# Patient Record
Sex: Female | Born: 1955
Health system: Southern US, Community
[De-identification: ages and names within clinical notes are randomized; demographics above are authoritative.]

## PROBLEM LIST (undated history)

## (undated) DIAGNOSIS — K219 Gastro-esophageal reflux disease without esophagitis: Secondary | ICD-10-CM

## (undated) DIAGNOSIS — Z808 Family history of malignant neoplasm of other organs or systems: Secondary | ICD-10-CM

## (undated) DIAGNOSIS — E890 Postprocedural hypothyroidism: Secondary | ICD-10-CM

## (undated) DIAGNOSIS — G4730 Sleep apnea, unspecified: Secondary | ICD-10-CM

## (undated) DIAGNOSIS — Z803 Family history of malignant neoplasm of breast: Secondary | ICD-10-CM

## (undated) DIAGNOSIS — I10 Essential (primary) hypertension: Secondary | ICD-10-CM

## (undated) DIAGNOSIS — E063 Autoimmune thyroiditis: Secondary | ICD-10-CM

## (undated) DIAGNOSIS — Z800 Family history of malignant neoplasm of digestive organs: Secondary | ICD-10-CM

## (undated) DIAGNOSIS — E0500 Thyrotoxicosis with diffuse goiter without thyrotoxic crisis or storm: Secondary | ICD-10-CM

## (undated) DIAGNOSIS — E042 Nontoxic multinodular goiter: Secondary | ICD-10-CM

## (undated) HISTORY — DX: Essential (primary) hypertension: I10

## (undated) HISTORY — DX: Thyrotoxicosis with diffuse goiter without thyrotoxic crisis or storm: E05.00

## (undated) HISTORY — DX: Postprocedural hypothyroidism: E89.0

## (undated) HISTORY — PX: TUBAL LIGATION: SHX77

## (undated) HISTORY — PX: THYROIDECTOMY, PARTIAL: SHX18

## (undated) HISTORY — DX: Family history of malignant neoplasm of breast: Z80.3

## (undated) HISTORY — DX: Autoimmune thyroiditis: E06.3

## (undated) HISTORY — DX: Nontoxic multinodular goiter: E04.2

## (undated) HISTORY — DX: Family history of malignant neoplasm of other organs or systems: Z80.8

## (undated) HISTORY — DX: Family history of malignant neoplasm of digestive organs: Z80.0

---

## 1982-11-24 HISTORY — PX: THYROIDECTOMY, PARTIAL: SHX18

## 1985-11-24 HISTORY — PX: TUBAL LIGATION: SHX77

## 1999-02-26 ENCOUNTER — Other Ambulatory Visit: Admission: RE | Admit: 1999-02-26 | Discharge: 1999-02-26 | Payer: Self-pay | Admitting: Obstetrics & Gynecology

## 2002-06-29 ENCOUNTER — Other Ambulatory Visit: Admission: RE | Admit: 2002-06-29 | Discharge: 2002-06-29 | Payer: Self-pay | Admitting: Obstetrics & Gynecology

## 2002-07-04 ENCOUNTER — Encounter: Payer: Self-pay | Admitting: Obstetrics & Gynecology

## 2002-07-04 ENCOUNTER — Encounter: Admission: RE | Admit: 2002-07-04 | Discharge: 2002-07-04 | Payer: Self-pay | Admitting: Obstetrics & Gynecology

## 2003-10-11 ENCOUNTER — Other Ambulatory Visit: Admission: RE | Admit: 2003-10-11 | Discharge: 2003-10-11 | Payer: Self-pay | Admitting: Obstetrics & Gynecology

## 2004-11-05 ENCOUNTER — Other Ambulatory Visit: Admission: RE | Admit: 2004-11-05 | Discharge: 2004-11-05 | Payer: Self-pay | Admitting: Obstetrics & Gynecology

## 2005-05-08 ENCOUNTER — Encounter: Admission: RE | Admit: 2005-05-08 | Discharge: 2005-05-08 | Payer: Self-pay | Admitting: Emergency Medicine

## 2005-05-18 ENCOUNTER — Encounter: Admission: RE | Admit: 2005-05-18 | Discharge: 2005-05-18 | Payer: Self-pay | Admitting: Emergency Medicine

## 2005-05-30 ENCOUNTER — Encounter: Admission: RE | Admit: 2005-05-30 | Discharge: 2005-05-30 | Payer: Self-pay | Admitting: Emergency Medicine

## 2005-07-21 ENCOUNTER — Encounter: Admission: RE | Admit: 2005-07-21 | Discharge: 2005-07-21 | Payer: Self-pay | Admitting: Otolaryngology

## 2005-07-21 ENCOUNTER — Encounter (INDEPENDENT_AMBULATORY_CARE_PROVIDER_SITE_OTHER): Payer: Self-pay | Admitting: *Deleted

## 2005-07-21 ENCOUNTER — Other Ambulatory Visit: Admission: RE | Admit: 2005-07-21 | Discharge: 2005-07-21 | Payer: Self-pay | Admitting: Interventional Radiology

## 2005-08-26 ENCOUNTER — Ambulatory Visit: Payer: Self-pay | Admitting: "Endocrinology

## 2005-12-03 ENCOUNTER — Encounter: Admission: RE | Admit: 2005-12-03 | Discharge: 2005-12-03 | Payer: Self-pay | Admitting: *Deleted

## 2006-01-06 ENCOUNTER — Ambulatory Visit: Payer: Self-pay | Admitting: "Endocrinology

## 2006-01-26 ENCOUNTER — Other Ambulatory Visit: Admission: RE | Admit: 2006-01-26 | Discharge: 2006-01-26 | Payer: Self-pay | Admitting: Obstetrics & Gynecology

## 2006-04-13 ENCOUNTER — Encounter: Admission: RE | Admit: 2006-04-13 | Discharge: 2006-04-13 | Payer: Self-pay | Admitting: "Endocrinology

## 2006-04-29 ENCOUNTER — Encounter (INDEPENDENT_AMBULATORY_CARE_PROVIDER_SITE_OTHER): Payer: Self-pay | Admitting: *Deleted

## 2006-04-29 ENCOUNTER — Encounter: Admission: RE | Admit: 2006-04-29 | Discharge: 2006-04-29 | Payer: Self-pay | Admitting: "Endocrinology

## 2006-04-29 ENCOUNTER — Other Ambulatory Visit: Admission: RE | Admit: 2006-04-29 | Discharge: 2006-04-29 | Payer: Self-pay | Admitting: Interventional Radiology

## 2006-05-12 ENCOUNTER — Ambulatory Visit: Payer: Self-pay | Admitting: "Endocrinology

## 2006-12-07 ENCOUNTER — Encounter: Admission: RE | Admit: 2006-12-07 | Discharge: 2006-12-07 | Payer: Self-pay | Admitting: "Endocrinology

## 2006-12-24 ENCOUNTER — Ambulatory Visit: Payer: Self-pay | Admitting: "Endocrinology

## 2007-05-19 ENCOUNTER — Encounter: Admission: RE | Admit: 2007-05-19 | Discharge: 2007-05-19 | Payer: Self-pay | Admitting: "Endocrinology

## 2007-06-02 ENCOUNTER — Ambulatory Visit: Payer: Self-pay | Admitting: "Endocrinology

## 2007-11-30 ENCOUNTER — Ambulatory Visit: Payer: Self-pay | Admitting: "Endocrinology

## 2008-05-31 ENCOUNTER — Encounter: Admission: RE | Admit: 2008-05-31 | Discharge: 2008-05-31 | Payer: Self-pay | Admitting: "Endocrinology

## 2008-06-22 ENCOUNTER — Ambulatory Visit: Payer: Self-pay | Admitting: "Endocrinology

## 2008-07-27 ENCOUNTER — Other Ambulatory Visit: Admission: RE | Admit: 2008-07-27 | Discharge: 2008-07-27 | Payer: Self-pay | Admitting: Interventional Radiology

## 2008-07-27 ENCOUNTER — Encounter: Admission: RE | Admit: 2008-07-27 | Discharge: 2008-07-27 | Payer: Self-pay | Admitting: "Endocrinology

## 2008-07-27 ENCOUNTER — Encounter (INDEPENDENT_AMBULATORY_CARE_PROVIDER_SITE_OTHER): Payer: Self-pay | Admitting: Interventional Radiology

## 2009-03-05 ENCOUNTER — Encounter: Admission: RE | Admit: 2009-03-05 | Discharge: 2009-03-05 | Payer: Self-pay | Admitting: "Endocrinology

## 2009-03-19 ENCOUNTER — Ambulatory Visit: Payer: Self-pay | Admitting: "Endocrinology

## 2010-03-07 ENCOUNTER — Encounter: Admission: RE | Admit: 2010-03-07 | Discharge: 2010-03-07 | Payer: Self-pay | Admitting: "Endocrinology

## 2010-03-28 ENCOUNTER — Ambulatory Visit: Payer: Self-pay | Admitting: "Endocrinology

## 2010-10-14 ENCOUNTER — Encounter: Admission: RE | Admit: 2010-10-14 | Discharge: 2010-10-14 | Payer: Self-pay | Admitting: Family Medicine

## 2011-03-05 ENCOUNTER — Other Ambulatory Visit: Payer: Self-pay | Admitting: "Endocrinology

## 2011-03-05 DIAGNOSIS — E049 Nontoxic goiter, unspecified: Secondary | ICD-10-CM

## 2011-03-14 ENCOUNTER — Other Ambulatory Visit: Payer: Self-pay

## 2011-03-17 ENCOUNTER — Other Ambulatory Visit: Payer: Self-pay | Admitting: *Deleted

## 2011-03-17 ENCOUNTER — Encounter: Payer: Self-pay | Admitting: *Deleted

## 2011-03-17 DIAGNOSIS — E041 Nontoxic single thyroid nodule: Secondary | ICD-10-CM

## 2011-03-17 DIAGNOSIS — E890 Postprocedural hypothyroidism: Secondary | ICD-10-CM

## 2011-04-01 ENCOUNTER — Ambulatory Visit
Admission: RE | Admit: 2011-04-01 | Discharge: 2011-04-01 | Disposition: A | Discharge status: Home / Self Care | Payer: 59 | Source: Ambulatory Visit | Attending: "Endocrinology | Admitting: "Endocrinology

## 2011-04-01 DIAGNOSIS — E049 Nontoxic goiter, unspecified: Secondary | ICD-10-CM

## 2011-04-10 ENCOUNTER — Encounter: Payer: Self-pay | Admitting: "Endocrinology

## 2011-04-10 ENCOUNTER — Ambulatory Visit (INDEPENDENT_AMBULATORY_CARE_PROVIDER_SITE_OTHER): Payer: 59 | Admitting: "Endocrinology

## 2011-04-10 VITALS — BP 111/69 | HR 65 | Wt 115.5 lb

## 2011-04-10 DIAGNOSIS — E063 Autoimmune thyroiditis: Secondary | ICD-10-CM

## 2011-04-10 DIAGNOSIS — E890 Postprocedural hypothyroidism: Secondary | ICD-10-CM

## 2011-04-10 DIAGNOSIS — E042 Nontoxic multinodular goiter: Secondary | ICD-10-CM

## 2011-04-10 DIAGNOSIS — E0500 Thyrotoxicosis with diffuse goiter without thyrotoxic crisis or storm: Secondary | ICD-10-CM

## 2011-04-10 NOTE — Progress Notes (Signed)
CC: FU of non-toxic multinodular goiter (NTMNG), exophthalmos, hypothyroidism postoperatively after partial thyroidectomy for Graves' Disease, thyroiditis  HPI: 55 y.o. white woman 1. Patient had a partial thyroidectomy for Thyrotoxicosis due to Graves' Disease about 1986. She had significant exophthalmos at that time. She remained euthyroid for about 10 years, then in about 1996 she became hypothyroid and had to begin Synthroid therapy. Since then she has had TFTs within the normal range on a dose of Synthroid, 75 mcg/day.  2. In early 2006 Ms. Romas developed pains in the posterior cervical spine radiating down her arms. An MRI on 06.25.06 showed   multiple bony lesions, but also a 1.4 cm lesion of the right thyroid lobe, probably consistent with an adenoma. A fine needle aspiration of the nodule was performed on 08.28.06. This study showed abundant colloid and numerous follicles in sheets and balls, more consistent with adenomatous goiter than neoplasia. 3. I saw Ms. Guillotte in consultation on 10.03.06. 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 The lower portion of her right thyroid lobe was relatively more firm than the remainder of the gland. I subsequently reviewed her [place] in radiology and concurred with its findings. I also reviewed her path slides in pathology.. I agreed with the pathologist that there was no evidence of atypia.TFTs showed that she was euthyroid on her Synthroid dose of 75 mcg per day. In May 2007 I ordered a repeat US. This time there was a suggestion that the dominant right lobe nodule and one other nodule might have grown slightly. I therefore ordered a repeat FNA. This study showed scattered groups of follicular epithelium with no atypia, more c/w adenomatous nodule than neoplasia. I again reviewed the path slides with the pathologist and concurred with her conclusions. We continued to do serial [place] studies every 6 months through  July 2009, which essentially remained unchanged. She had her third FNA on 09.04.09. I reviewed those slides with the pathologist, who felt that the findings were most c/w a non-neoplastic goiter. I concurred. Since then we have performed serial [place] studies annually, the most recent being 05.08.12. If anything, the dominant nodule and the other smaller nodules seem to be a bit smaller over time. She remains euthyroid on 75 mcg of Synthroid per day.   4. Ms. Delsanto's last [facility] visit was on 05.05.11. In the interim she has been actively trying to lose weight. She has been eating better. She exercises almost daily. As a result, she has lost about 20 pounds.About two months ago she had a strep throat infection and developed a scarlet fever rash. She was treated with oral antibiotics.  5. PROS: Constitutional: The patient feels well, is healthy, and has no significant complaints. Eyes: Vision is good. There are no significant eye complaints.The exophthalmos continues to improve. Neck: The patient has no complaints of anterior neck swelling, soreness, tenderness,  pressure, discomfort, or difficulty swallowing.  Heart: Heart rate increases with exercise or other physical activity. The patient has no complaints of palpitations, irregular heat beats, chest pain, or chest pressure. Gastrointestinal: Bowel movents seem normal. The patient has no complaints of excessive hunger, acid reflux, upset stomach, stomach aches or pains, diarrhea, or constipation. Legs: Muscle mass and strength seem normal. There are no complaints of numbness, tingling, burning, or pain. No edema is noted. Feet: There are no obvious foot problems. There are no complaints of numbness, tingling, burning, or pain. No edema is noted GYN: Her IUD  was removed sis months ago. She has not had menses since then. She does have occasional hot flushes.Marland Kitchen  PMFSH: 1.She is still very busy at work and at home.  ROS: Ms. Pruden has no significant  complaints related to any of her other eleven body systems.  PHYSICAL EXAM: BP 111/69  Pulse 65  Wt 115 lb 8 oz (52.39 kg) Constitutional: The patient looks healthy and appears physically and emotionally well.  Eyes: There is no arcus or proptosis, but her eyelids are still somewhat swollen. Mouth: The oropharynx appears normal. The tongue appears normal. There is normal oral moisture. There is no obvious gingivitis. Neck: There are no bruits present. The thyroid gland appears normal in size. The thyroid gland is approximately 18-20 grams in size. The consistency of the thyroid gland is normal. There is no thyroid tenderness to palpation. Lungs: The lungs are clear. Air movement is good. Heart: The heart rhythm and rate appear normal. Heart sounds S1 and S2 are normal. I do not appreciate any pathologic heart murmurs. Abdomen: The abdominal size is normal. Bowel sounds are normal. The abdomen is soft and non-tender. There is no obviously palpable hepatomegaly, splenomegaly, or other masses.  Arms: Muscle mass appears appropriate for age. Radial pulses appear normal. Hands: There is no obvious tremor. Phalangeal and metacarpophalangeal joints appear normal. Palms are normal. Legs: Muscle mass appears appropriate for age. There is no edema.  Neurologic: Muscle strength is normal for age and gender  in both the upper and the lower extremities. Muscle tone appears normal. Sensation to touch is normal in the legs.  Thyroid US 05.08.12: The dominant nodule in the right lobe is stable in size, or perhaps 1-2 mm smaller. Labs 05.08.12: TFTs were normal.  ASSESSMENT: 1 Hypothyroid: She is euthyroid on her current dose of Synthroid daily. 2. Non-toxic multinodular goiter: Nodules are essentially unchanged in size or consistency, She does not need a repeat FNA at this time.  3. Exophthalmos: This problem continues to slowly but progressively improve. 4, Hashimoto's disease: This problem is clinically  quiescent. Some of the fluctuations in  TFTs that she has had are consistent with intermittent thyroiditis activity.  PLAN: 1. Repeat TFTs and thyroid US in one year. 2. Continue Synthroid at current dose. 3. Follow-up appointment in one year.

## 2011-04-10 NOTE — Patient Instructions (Signed)
Please repeat thyroid function tests in November. Please also repeat thyroid functio tests and thyroid ultrasound in Apri-May 2012.

## 2011-11-02 ENCOUNTER — Other Ambulatory Visit: Payer: Self-pay | Admitting: "Endocrinology

## 2012-06-09 ENCOUNTER — Other Ambulatory Visit: Payer: Self-pay | Admitting: "Endocrinology

## 2012-06-10 ENCOUNTER — Other Ambulatory Visit: Payer: Self-pay | Admitting: "Endocrinology

## 2012-06-11 ENCOUNTER — Other Ambulatory Visit: Payer: Self-pay | Admitting: *Deleted

## 2012-06-11 DIAGNOSIS — E038 Other specified hypothyroidism: Secondary | ICD-10-CM

## 2012-06-14 LAB — T4, FREE: Free T4: 1.37 ng/dL (ref 0.80–1.80)

## 2012-06-15 ENCOUNTER — Ambulatory Visit
Admission: RE | Admit: 2012-06-15 | Discharge: 2012-06-15 | Disposition: A | Discharge status: Home / Self Care | Payer: 59 | Source: Ambulatory Visit | Attending: "Endocrinology | Admitting: "Endocrinology

## 2012-06-15 DIAGNOSIS — E038 Other specified hypothyroidism: Secondary | ICD-10-CM

## 2012-06-23 ENCOUNTER — Encounter: Payer: Self-pay | Admitting: "Endocrinology

## 2012-06-23 ENCOUNTER — Ambulatory Visit (INDEPENDENT_AMBULATORY_CARE_PROVIDER_SITE_OTHER): Payer: 59 | Admitting: "Endocrinology

## 2012-06-23 VITALS — BP 123/78 | HR 98 | Wt 123.2 lb

## 2012-06-23 DIAGNOSIS — E063 Autoimmune thyroiditis: Secondary | ICD-10-CM

## 2012-06-23 DIAGNOSIS — E0500 Thyrotoxicosis with diffuse goiter without thyrotoxic crisis or storm: Secondary | ICD-10-CM

## 2012-06-23 DIAGNOSIS — E890 Postprocedural hypothyroidism: Secondary | ICD-10-CM

## 2012-06-23 DIAGNOSIS — E042 Nontoxic multinodular goiter: Secondary | ICD-10-CM

## 2012-06-23 DIAGNOSIS — E349 Endocrine disorder, unspecified: Secondary | ICD-10-CM

## 2012-06-23 NOTE — Progress Notes (Signed)
CC: FU of non-toxic multinodular goiter (NTMNG), exophthalmos, hypothyroidism postoperatively after partial thyroidectomy for Graves' Disease, thyroiditis  HPI: 56 y.o. Caucasian woman 1. Patient had a partial thyroidectomy for Thyrotoxicosis for treatment of Graves' Disease about 1986. She had significant exophthalmos at that time. She remained euthyroid for about 10 years, then in about 1996 she became hypothyroid and had to begin Synthroid therapy. Since then she has had TFTs within the normal range on a dose of Synthroid, 75 mcg/day.  2. In early 2006 Ms. Colasurdo developed pains in the posterior cervical spine radiating down her arms. An MRI on 06.25.06 showed   multiple bony lesions, but also a 1.4 cm lesion of the right thyroid lobe, probably consistent with an adenoma. A fine needle aspiration of the nodule was performed on 08.28.06. This study showed abundant colloid and numerous follicles in sheets and balls, more consistent with adenomatous goiter than neoplasia. 3. I saw Ms. Doland in consultation on 10.03.06. 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 The lower portion of her right thyroid lobe was relatively more firm than the remainder of the gland. I subsequently reviewed her [place] in radiology and concurred with its findings. I also reviewed her path slides in pathology. I agreed with the pathologist that there was no evidence of atypia.TFTs showed that she was euthyroid on her Synthroid dose of 75 mcg per day. In May 2007 I ordered a repeat US. This time there was a suggestion that the dominant right lobe nodule and one other nodule might have grown slightly. I therefore ordered a repeat FNA. This study showed scattered groups of follicular epithelium with no atypia, more c/w adenomatous nodule than neoplasia. I again reviewed the path slides with the pathologist and concurred with her conclusions. We continued to do serial [place] studies every 6  months through July 2009, which essentially remained unchanged. She had her third FNA on 09.04.09. I reviewed those slides with the pathologist, who felt that the findings were most c/w a non-neoplastic goiter. I concurred. Since then we have performed serial [place] studies annually, the most recent being 04/01/11. The dominant nodule and the other smaller nodules seemed to be a bit smaller over time. She has remained euthyroid on 75 mcg of Synthroid per day.   4. Ms. Vasconez last [facility] visit was on 04/10/11. In the interim she lost weight intentionally with diet and exercise, then reduced the exercise and re-gained some of the weight. For about 6 weeks she's noted that her stomach hurts when she eats and sometimes when she drinks. The discomfort is located at the junction of the epigastrium and LUQ. She is being followed by Dr. Lavonia Drafts in GI.  5. Pertinent Review of Systems: Constitutional: The patient has felt "a little tired, less peppy" for the past few weeks. She wakes up several times each night, so the quality of her sleep is not as good. She has coffee in the morning and sometimes tea at lunch.  Eyes: Vision is good. There are no significant eye complaints.The exophthalmos continues to improve. Neck: The patient has no complaints of anterior neck swelling, soreness, tenderness, pressure, discomfort, or difficulty swallowing.  Heart: Heart rate increases with exercise or other physical activity. The patient has no complaints of palpitations, irregular heat beats, chest pain, or chest pressure. Gastrointestinal: As above. Bowel movents seem normal. The patient has no complaints of excessive hunger, acid reflux, upset stomach, diarrhea, or constipation. Legs: Muscle mass  and strength seem normal. There are no complaints of numbness, tingling, burning, or pain. No edema is noted. Feet: There are no obvious foot problems. There are no complaints of numbness, tingling, burning, or pain. No edema is  noted GYN: Her IUD was removed almost two years ago. Menses never returned.  She does have occasional hot flushes, but nothing severe.  PAST MEDICAL, FAMILY, AND SOCIAL HISTORY:  1. Work and family: She is still very busy at work and at home. She works about 40-45 hours per week. 2. Activities: She was going to the gym 3-4 times a week for an hour each time, but has not gone in the past 3 weeks. She also walks her dog regularly.  3. Primary care: Dr. Ancil Boozer, [facility] at [facility]. GI: Dr. Susanne Greenhouse.  REVIEW OF SYSTEMS: Ms. Choy has no significant complaints related to any of her other body systems.  PHYSICAL EXAM: BP 123/78  Pulse 98  Wt 123 lb 3.2 oz (55.883 kg) Constitutional: The patient looks healthy and appears physically and emotionally well.  Eyes: There is no arcus or proptosis. Her right eye is slightly more prominent than her left, but within the range of normal variance. She has a minimal amount of lower eyelid swelling on the right, again within the range of normal variance for her age.  Mouth: The oropharynx appears normal. The tongue appears normal. There is normal oral moisture. There is no obvious gingivitis. Neck: There are no bruits present. The thyroid gland is normal in size. The thyroid gland is approximately 18-20 grams in size. The consistency of the thyroid gland is normal. There is no thyroid tenderness to palpation. Supraclavicular areas are normal.  Lungs: The lungs are clear. Air movement is good. Heart: The heart rhythm and rate appear normal. Heart sounds S1 and S2 are normal. I do not appreciate any pathologic heart murmurs. Abdomen: The abdominal size is normal. Bowel sounds are normal. The abdomen is soft and non-tender. There is no obviously palpable hepatomegaly, splenomegaly, or other masses.  Arms: Muscle mass appears appropriate for age.  Hands: There is no obvious tremor. Phalangeal and metacarpophalangeal joints appear normal. Palms are  normal. Legs: Muscle mass appears appropriate for age. There is no edema.  Neurologic: Muscle strength is normal for age and gender  in both the upper and the lower extremities. Muscle tone appears normal. Sensation to touch is normal in the legs.  Thyroid US 7.23.13: The dominant nodule in the right lobe is  2.7 x 2.0 x 1.8 cm vs 2.3 x 1.7 x 1.6, slightly larger than on 04/01/11. Left lobe nodule is 1.2 x 0.8 x 0.8 cm, stable in size. I reviewed her images with Dr. Archer Asa in [facility]. The [place] in 2012 cut across the nodule parallel to the long axis of the nodule. The [place] this year cut across the nodule on a slight diagonal. It appears that the difference in technique probably accounts for the difference in measurements. Dr. Archer Asa recommended repeating the [place] in 6 months.  Labs 07.22.13: TFTs were normal (TSH 1.675, free T4 1.37, free T3 2.4).  ASSESSMENT: 1 Hypothyroid: She is euthyroid on her current dose of Synthroid taken each evening. 2. Non-toxic multinodular goiter: Nodules are probably unchanged in size and are unchanged in consistency. She does not need a repeat FNA at this time. It is reasonable to repeat the [place] in 6 months to ensure that there is no significant interval change. I offered the patient the  options of repeating the [place] in 6 months or going ahead now with a 4th FNA. She prefers to wait 6 months to see if a repeat FNA is really necessary.  3. Exophthalmos: This problem continues to slowly but progressively improve. If I didn't know that she had exophthalmos in the past I would not diagnose it now.  4, Hashimoto's disease: This problem is clinically quiescent.   PLAN: 1. Repeat  thyroid US in six months and TFTs in one year.  2. Continue Synthroid at current dose. 3. Follow-up appointment in 6 months.  Level of Service: This visit lasted in excess of 40 minutes. More than 50% of the visit was devoted to counseling.  David Stall

## 2012-06-23 NOTE — Patient Instructions (Signed)
Repeat US in January 2014. Follow-up visit with Dr. Van Clines soon after the [place]. Continue current dose of Synthroid.

## 2012-06-30 ENCOUNTER — Other Ambulatory Visit: Payer: Self-pay | Admitting: Gastroenterology

## 2012-07-07 ENCOUNTER — Other Ambulatory Visit: Payer: Self-pay | Admitting: Gastroenterology

## 2012-07-07 DIAGNOSIS — R1012 Left upper quadrant pain: Secondary | ICD-10-CM

## 2012-07-09 ENCOUNTER — Ambulatory Visit
Admission: RE | Admit: 2012-07-09 | Discharge: 2012-07-09 | Disposition: A | Discharge status: Home / Self Care | Payer: 59 | Source: Ambulatory Visit | Attending: Gastroenterology | Admitting: Gastroenterology

## 2012-07-09 ENCOUNTER — Other Ambulatory Visit: Payer: Self-pay | Admitting: Internal Medicine

## 2012-07-09 ENCOUNTER — Ambulatory Visit
Admission: RE | Admit: 2012-07-09 | Discharge: 2012-07-09 | Disposition: A | Discharge status: Home / Self Care | Payer: 59 | Source: Ambulatory Visit

## 2012-07-09 DIAGNOSIS — E042 Nontoxic multinodular goiter: Secondary | ICD-10-CM

## 2012-07-09 DIAGNOSIS — R1012 Left upper quadrant pain: Secondary | ICD-10-CM

## 2012-07-12 ENCOUNTER — Other Ambulatory Visit: Payer: 59

## 2012-07-13 ENCOUNTER — Other Ambulatory Visit: Payer: Self-pay | Admitting: Gastroenterology

## 2012-07-13 DIAGNOSIS — R1012 Left upper quadrant pain: Secondary | ICD-10-CM

## 2012-07-15 ENCOUNTER — Ambulatory Visit
Admission: RE | Admit: 2012-07-15 | Discharge: 2012-07-15 | Disposition: A | Discharge status: Home / Self Care | Payer: 59 | Source: Ambulatory Visit | Attending: Gastroenterology | Admitting: Gastroenterology

## 2012-07-15 DIAGNOSIS — R1012 Left upper quadrant pain: Secondary | ICD-10-CM

## 2012-07-15 MED ORDER — IOHEXOL 300 MG/ML  SOLN
100.0000 mL | Freq: Once | INTRAMUSCULAR | Status: AC | PRN
  Administered 2012-07-15: 100 mL via INTRAVENOUS

## 2012-09-13 NOTE — Addendum Note (Signed)
Addended by: David Stall on: 09/13/2012 06:22 PM   Modules accepted: Orders

## 2012-09-21 LAB — T4, FREE: Free T4: 1.44 ng/dL (ref 0.80–1.80)

## 2012-09-21 LAB — TSH: TSH: 1.97 u[IU]/mL (ref 0.350–4.500)

## 2012-09-22 ENCOUNTER — Telehealth: Payer: Self-pay | Admitting: "Endocrinology

## 2012-09-22 NOTE — Telephone Encounter (Signed)
I called the patient to give her the lab results from yesterday. Her TFTs are still very normal on her current dose of Synthroid, 75 mcg/day. Her thyroid hormone levels are not the cause of her sodium problems. She said that the CT of her abdomen and [place] studies have all been normal. She is due for an EGD next week. She has a FU appointment with me in about two months.  David Stall

## 2012-10-13 ENCOUNTER — Ambulatory Visit: Payer: 59 | Admitting: "Endocrinology

## 2012-12-07 ENCOUNTER — Telehealth: Payer: Self-pay | Admitting: "Endocrinology

## 2012-12-07 DIAGNOSIS — E042 Nontoxic multinodular goiter: Secondary | ICD-10-CM

## 2012-12-07 NOTE — Telephone Encounter (Signed)
1. Patient sent me an e-mail today, stating that she feels well. She is due to see me next week, but since her blood work was done at the end of October, since the labs were normal, and since she feels good, can we postpone her visit for several months?  2. I called her on her cell phone. I have no problem with waiting until April to repeat her lab tests and to see her in follow up. I would like to have her repeat her thyroid US within the next month, which will be about 9 months since her last Korea, about half-way between the 6 and 12 month marks. She agrees.  3. She will call our office tomorrow and re-schedule her appointment for April. I will put in the order now for a thyroid US. She will have labs drawn about one week prior to her April visit.  David Stall

## 2012-12-13 ENCOUNTER — Ambulatory Visit: Payer: Self-pay | Admitting: "Endocrinology

## 2013-01-03 ENCOUNTER — Ambulatory Visit
Admission: RE | Admit: 2013-01-03 | Discharge: 2013-01-03 | Disposition: A | Discharge status: Home / Self Care | Payer: 59 | Source: Ambulatory Visit | Attending: "Endocrinology | Admitting: "Endocrinology

## 2013-01-06 ENCOUNTER — Other Ambulatory Visit: Payer: Self-pay | Admitting: "Endocrinology

## 2013-01-12 ENCOUNTER — Telehealth: Payer: Self-pay | Admitting: *Deleted

## 2013-01-12 NOTE — Telephone Encounter (Signed)
LVM to inform per Dr. Fransico Michael that [place] is unchanged, and Her dominant nodule is probably a little smaller, Joylene Grapes, [employer] fundraiser

## 2013-02-18 ENCOUNTER — Other Ambulatory Visit: Payer: Self-pay | Admitting: *Deleted

## 2013-02-18 DIAGNOSIS — E038 Other specified hypothyroidism: Secondary | ICD-10-CM

## 2013-02-22 LAB — TSH: TSH: 1.036 u[IU]/mL (ref 0.350–4.500)

## 2013-02-22 LAB — T3, FREE: T3, Free: 2.4 pg/mL (ref 2.3–4.2)

## 2013-02-22 LAB — T4, FREE: Free T4: 1.14 ng/dL (ref 0.80–1.80)

## 2013-02-28 ENCOUNTER — Ambulatory Visit (INDEPENDENT_AMBULATORY_CARE_PROVIDER_SITE_OTHER): Payer: 59 | Admitting: "Endocrinology

## 2013-02-28 ENCOUNTER — Encounter: Payer: Self-pay | Admitting: "Endocrinology

## 2013-02-28 VITALS — BP 112/74 | HR 72 | Wt 131.2 lb

## 2013-02-28 DIAGNOSIS — H0520 Unspecified exophthalmos: Secondary | ICD-10-CM

## 2013-02-28 DIAGNOSIS — E041 Nontoxic single thyroid nodule: Secondary | ICD-10-CM

## 2013-02-28 DIAGNOSIS — E063 Autoimmune thyroiditis: Secondary | ICD-10-CM

## 2013-02-28 DIAGNOSIS — E038 Other specified hypothyroidism: Secondary | ICD-10-CM

## 2013-02-28 NOTE — Progress Notes (Signed)
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 Caucasian woman 1. Patient had a partial thyroidectomy for thyrotoxicosis associated with Graves' Disease about 3. She had significant exophthalmos at that time. She remained euthyroid for about 10 years, then in about 1996 she became hypothyroid and had to begin Synthroid therapy. Since then she has had TFTs within the normal range on a dose of Synthroid, 75 mcg/day.  2. In early 2006 Ms. Harman developed pains in the posterior cervical spine radiating down her arms. An MRI on 05/18/05 showed   multiple bony lesions, but also a 1.4 cm lesion of the right thyroid lobe, probably consistent with an adenoma. A fine needle aspiration of the nodule was performed on 07/21/05. This study showed abundant colloid and numerous follicles in sheets and balls, more consistent with adenomatous goiter than neoplasia. 3. I saw Ms. Burrough in consultation on 08/26/05. 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 The lower portion of her right thyroid lobe was relatively more firm than the remainder of the gland. I subsequently reviewed her [place] in radiology and concurred with its findings. I also reviewed her path slides in pathology. I agreed with the pathologist that there was no evidence of atypia.TFTs showed that she was euthyroid on her Synthroid dose of 75 mcg per day. In May 2007 I ordered a repeat US. This time there was a suggestion that the dominant right lobe nodule and one other nodule might have grown slightly. I therefore ordered a repeat FNA. This study showed scattered groups of follicular epithelium with no atypia, more c/w adenomatous nodule than neoplasia. I again reviewed the path slides with the pathologist and concurred with  her conclusions. We continued to do serial [place] studies every 6 months through July 2009, which essentially remained unchanged. She had her third FNA on 07/28/08. I again reviewed those slides with the pathologist, who felt that the findings were most c/w a non-neoplastic goiter. I concurred. Since then we have performed serial [place] studies annually, the most recent being 01/03/13. The dominant nodule and the other smaller nodules seemed to be a bit smaller over time. She has remained euthyroid on 75 mcg of Synthroid per day.   4. Ms. Stjulien last P[facility] visit was on 06/23/12. In the interim she has been re-gaining weight. She has been having neck pains due to DJD. She had a recent MRI on 01/27/13: The MRI showed significant disc bulging and right foraminal stenosis at the C3-4 level. She has right-sided nuchal pains and trapezius pains, both worse at the end of the day. In retrospect, she has had similar problems dating back to at least 2006. She has had cervical traction at home in the past. She has not been exercising as much for the past 4-5 months. Her stomach problems have resolved about 90%. Her abdominal CT scan and [place] were non-diagnostic. She stopped Nexium due to concerns that it seemed to stop working after a while. When she stopped the Nexium her belly hunger improved. She remains on Synthroid, 75 mcg/day. 5. Pertinent Review of Systems: Constitutional: The patient feels "OK". She does not feel sick, tired, or ill. Energy level is usually pretty good. She no longer wakes up several times each night, so the quality of her sleep is better.  Eyes: Vision is good. There are no  significant eye complaints.The exophthalmos has essentially resolved.  Neck: The patient has no complaints of anterior neck swelling, soreness, tenderness, pressure, discomfort, or difficulty swallowing.  Heart: Heart rate increases with exercise or other physical activity. The patient has no complaints of palpitations, irregular heat  beats, chest pain, or chest pressure. Gastrointestinal: As above. Bowel movents seem normal. The patient has no complaints of excessive hunger, acid reflux, upset stomach, diarrhea, or constipation. Legs: Muscle mass and strength seem normal. There are no complaints of numbness, tingling, burning, or pain. No edema is noted. Feet: There are no obvious foot problems. There are no complaints of numbness, tingling, burning, or pain. No edema is noted GYN: Her IUD was removed more than two years ago. Menses never returned.  She does not have hot flushes very often.  PAST MEDICAL, FAMILY, AND SOCIAL HISTORY:  1. Work and family: She is still very busy at work and at home. She works about 40-45 hours per week. 2. Activities: She was going to the gym 3-4 times a week for an hour each time, but has not gone in the past 4-5 months. She is not  walking her dog very often.  3. Primary care: Dr.Aaron Kenna Gilbert at [facility]. GI: Dr. Susanne Greenhouse.  REVIEW OF SYSTEMS: Ms. Vath has no significant complaints related to any of her other body systems.  PHYSICAL EXAM: BP 112/74  Pulse 72  Wt 131 lb 3.2 oz (59.512 kg) Constitutional: The patient looks healthy and appears physically and emotionally well. She has gained 8 pounds since the last visit.  Eyes: There is no arcus or proptosis. Her right eye is no longer  more prominent than her left. Both are within the range of normal variance. Mouth: The oropharynx appears normal. The tongue appears normal. There is normal oral moisture. Neck: There are no bruits present. The thyroid gland is normal in size at approximately 18-20 grams. The consistency of the thyroid gland is normal. There is no thyroid tenderness to palpation.  Lungs: The lungs are clear. Air movement is good. Heart: The heart rhythm and rate appear normal. Heart sounds S1 and S2 are normal. I do not appreciate any pathologic heart murmurs. Abdomen: The abdominal size is normal, but  slightly more enlarged.  Bowel sounds are normal. The abdomen is soft and non-tender. There is no obviously palpable hepatomegaly, splenomegaly, or other masses.  Arms: Muscle mass appears appropriate for age.  Hands: There is no obvious tremor. Phalangeal and metacarpophalangeal joints appear normal. Palms are normal. Legs: Muscle mass appears appropriate for age. There is no edema.  Neurologic: Muscle strength is normal for age and gender  in both the upper and the lower extremities. Muscle tone appears normal. Sensation to touch is normal in the legs.  Thyroid US 01/03/13: The dominant nodule in the right lobe was 1.5 x 2.0 x 2.3 cm, slightly smaller. Her left lobe nodule was 7 x 8 x 9 mm, also smaller.  Thyroid US 06/15/12: The dominant nodule in the right lobe is  2.7 x 2.0 x 1.8 cm vs 2.3 x 1.7 x 1.6, slightly larger than on 04/01/11. Left lobe nodule is 1.2 x 0.8 x 0.8 cm, stable in size. I reviewed her images with Dr. Archer Asa in [facility]. The [place] in 2012 cut across the nodule parallel to the long axis of the nodule. The [place] this year cut across the nodule on a slight diagonal. It appears that the difference in technique probably accounts for the  difference in measurements. Dr. Archer Asa recommended repeating the [place] in 6 months.  LAB DATA: 02/21/13: TSH 1.036, free T4 1.14, free T3 2.4 09/21/12: TSH 1.970, free T4 1.44, free T3 2.6  05/2212: TSH 1.675, free T4 1.37, free T3 2.4).  ASSESSMENT: 1 Hypothyroid: She is euthyroid on her current dose of Synthroid taken each evening. 2. Non-toxic multinodular goiter: Nodules are slightly smaller than at last [place]. She does not need a repeat FNA at this time. Because her nodule has been so stable in size and has been normal on FNA on three successive occasions, we can safely reduce the frequency of [place] studies.  3. Exophthalmos: This problem continues to slowly but progressively improve.  I do not see any difference in her eyes today.  4.  Hashimoto's disease: This problem is clinically quiescent.   PLAN: 1. Repeat  thyroid US in two years. Repeat TFTs in one year.  2. Continue Synthroid at current dose. 3. Follow-up appointment in 12 months.  Level of Service: This visit lasted in excess of 45 minutes. More than 50% of the visit was devoted to counseling.  David Stall

## 2013-02-28 NOTE — Patient Instructions (Signed)
Follow up in one year. Repeat thyroid blood tests one week prior.

## 2013-03-03 ENCOUNTER — Other Ambulatory Visit: Payer: Self-pay | Admitting: Neurosurgery

## 2013-03-03 DIAGNOSIS — M5410 Radiculopathy, site unspecified: Secondary | ICD-10-CM

## 2013-03-03 DIAGNOSIS — M542 Cervicalgia: Secondary | ICD-10-CM

## 2013-03-11 ENCOUNTER — Ambulatory Visit
Admission: RE | Admit: 2013-03-11 | Discharge: 2013-03-11 | Disposition: A | Discharge status: Home / Self Care | Payer: 59 | Source: Ambulatory Visit | Attending: Neurosurgery | Admitting: Neurosurgery

## 2013-03-11 DIAGNOSIS — M542 Cervicalgia: Secondary | ICD-10-CM

## 2013-03-11 DIAGNOSIS — M5410 Radiculopathy, site unspecified: Secondary | ICD-10-CM

## 2013-03-11 MED ORDER — TRIAMCINOLONE ACETONIDE 40 MG/ML IJ SUSP (RADIOLOGY)
60.0000 mg | Freq: Once | INTRAMUSCULAR | Status: AC
  Administered 2013-03-11: 60 mg via EPIDURAL

## 2013-03-11 MED ORDER — IOHEXOL 300 MG/ML  SOLN
1.0000 mL | Freq: Once | INTRAMUSCULAR | Status: AC | PRN
  Administered 2013-03-11: 1 mL via EPIDURAL

## 2013-04-25 ENCOUNTER — Other Ambulatory Visit: Payer: Self-pay | Admitting: Neurosurgery

## 2013-04-25 DIAGNOSIS — M47812 Spondylosis without myelopathy or radiculopathy, cervical region: Secondary | ICD-10-CM

## 2013-04-28 ENCOUNTER — Ambulatory Visit
Admission: RE | Admit: 2013-04-28 | Discharge: 2013-04-28 | Disposition: A | Discharge status: Home / Self Care | Payer: 59 | Source: Ambulatory Visit | Attending: Neurosurgery | Admitting: Neurosurgery

## 2013-04-28 DIAGNOSIS — M47812 Spondylosis without myelopathy or radiculopathy, cervical region: Secondary | ICD-10-CM

## 2013-04-28 MED ORDER — TRIAMCINOLONE ACETONIDE 40 MG/ML IJ SUSP (RADIOLOGY)
60.0000 mg | Freq: Once | INTRAMUSCULAR | Status: AC
  Administered 2013-04-28: 60 mg via EPIDURAL

## 2013-04-28 MED ORDER — IOHEXOL 300 MG/ML  SOLN
1.0000 mL | Freq: Once | INTRAMUSCULAR | Status: AC | PRN
  Administered 2013-04-28: 1 mL via EPIDURAL

## 2013-07-31 ENCOUNTER — Other Ambulatory Visit: Payer: Self-pay | Admitting: "Endocrinology

## 2014-01-30 ENCOUNTER — Other Ambulatory Visit: Payer: Self-pay | Admitting: *Deleted

## 2014-01-30 ENCOUNTER — Telehealth: Payer: Self-pay | Admitting: "Endocrinology

## 2014-01-30 DIAGNOSIS — E038 Other specified hypothyroidism: Secondary | ICD-10-CM

## 2014-01-30 NOTE — Telephone Encounter (Signed)
Handled by Amparo Bristol.

## 2014-02-17 LAB — TSH: TSH: 1.862 u[IU]/mL (ref 0.350–4.500)

## 2014-02-17 LAB — T3, FREE: T3 FREE: 2.8 pg/mL (ref 2.3–4.2)

## 2014-02-17 LAB — T4, FREE: FREE T4: 1.61 ng/dL (ref 0.80–1.80)

## 2014-02-20 ENCOUNTER — Encounter: Payer: Self-pay | Admitting: *Deleted

## 2014-02-22 ENCOUNTER — Encounter: Payer: Self-pay | Admitting: "Endocrinology

## 2014-02-22 ENCOUNTER — Ambulatory Visit (INDEPENDENT_AMBULATORY_CARE_PROVIDER_SITE_OTHER): Payer: 59 | Admitting: "Endocrinology

## 2014-02-22 VITALS — BP 123/85 | HR 78 | Wt 133.0 lb

## 2014-02-22 DIAGNOSIS — H0520 Unspecified exophthalmos: Secondary | ICD-10-CM | POA: Insufficient documentation

## 2014-02-22 DIAGNOSIS — E890 Postprocedural hypothyroidism: Secondary | ICD-10-CM

## 2014-02-22 DIAGNOSIS — I10 Essential (primary) hypertension: Secondary | ICD-10-CM | POA: Insufficient documentation

## 2014-02-22 DIAGNOSIS — E063 Autoimmune thyroiditis: Secondary | ICD-10-CM | POA: Insufficient documentation

## 2014-02-22 DIAGNOSIS — E038 Other specified hypothyroidism: Secondary | ICD-10-CM

## 2014-02-22 DIAGNOSIS — E042 Nontoxic multinodular goiter: Secondary | ICD-10-CM

## 2014-02-22 NOTE — Patient Instructions (Signed)
Follow up visit in one year. 

## 2014-02-22 NOTE — Progress Notes (Signed)
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 Caucasian woman. She was unaccompanied.  1. Beth Fuller had a partial thyroidectomy for thyrotoxicosis associated with Graves' Disease about 1986. She had significant exophthalmos at that time. She remained euthyroid for about 10 years, then in about 1996 she became hypothyroid and had to begin Synthroid therapy. Since then she has had TFTs within the normal range on a dose of Synthroid, 75 mcg/day.   2. In early 2006 Beth Fuller developed pains in the posterior cervical spine radiating down her arms. An MRI on 05/18/05 showed   multiple bony lesions, but also a 1.4 cm lesion of the right thyroid lobe, probably consistent with an adenoma. A fine needle aspiration of the nodule was performed on 07/21/05. This study showed abundant colloid and numerous follicles in sheets and balls, more consistent with adenomatous goiter than neoplasia.  3. I saw Beth Fuller in consultation on 08/26/05. She had just a slight prominence of her right eye as a residual effect of  her Graves ophthalmopathy. The lower portion of her right thyroid lobe was relatively more firm than the remainder of the gland. I subsequently reviewed her [place] in radiology and concurred with its findings. I also reviewed her path slides in pathology. I agreed with the pathologist that there was no evidence of atypia.TFTs showed that she was euthyroid on her Synthroid dose of 75 mcg per day. In May 2007 I ordered a repeat US. This time there was a suggestion that the dominant right lobe nodule and one other nodule might have grown slightly. I therefore ordered a repeat FNA. This study showed scattered groups of follicular epithelium with no atypia, more c/w adenomatous nodule than neoplasia. I again reviewed  the path slides with the pathologist and concurred with her conclusions. We continued to do serial [place] studies every 6 months through July 2009, which essentially remained unchanged. She had her third FNA on 07/28/08. I again reviewed those slides with the pathologist, who felt that the findings were most c/w a non-neoplastic goiter. I concurred. Since then we have performed serial [place] studies annually, the most recent being 01/03/13. The dominant nodule and the other smaller nodules seemed to be a bit smaller over time. She has remained euthyroid on 75 mcg of Synthroid per day.    4. Beth Fuller's last [facility] visit was on 0407/14. In the interim she has been healthy.   A. She is being treated with gabapentin for her cervical arthritis and radiculopathy. She has also had PT, cervical traction, and cervical stretching exercises.The tingling and pain in her right lateral neck, right chest, and occasionally right arm have greatly improved.  B. She is frustrated that she has gained some weight in the past few years.   C. Her GI symptoms have resolved. She no longer needs to take Nexium. She remains on Synthroid, 75 mcg/day. She also remains on Lopressor, imipramine, and Klonopin.    5. Pertinent Review of Systems: Constitutional: The patient feels "better, pretty good, probably better now than she did in her 66s". Since the kids grew up and left the house life is less stressful. Energy level is usually pretty good. She sleeps well.   Eyes: Vision is good. There are no significant eye complaints.Her exophthalmos has essentially resolved.  Neck: The patient has no complaints of anterior neck swelling, soreness,  tenderness, pressure, discomfort, or difficulty swallowing.  Heart: Heart rate increases with exercise or other physical activity. The patient has no complaints of palpitations, irregular heat beats, chest pain, or chest pressure. Gastrointestinal: Bowel movents seem normal. The patient has no complaints of  excessive hunger, acid reflux, upset stomach, diarrhea, or constipation. Legs: Muscle mass and strength seem normal. She occasionally has restless legs at night. There are no complaints of numbness, tingling, burning, or pain. No edema is noted. Feet: There are no obvious foot problems. There are no complaints of numbness, tingling, burning, or pain. No edema is noted GYN: Her IUD was removed more than two years ago. Menses never returned.  She has hot flashes 2-3 times per week, which do not bother her excessively.   PAST MEDICAL, FAMILY, AND SOCIAL HISTORY:  1. Work and family: She is still very busy at work and at home. 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 2. Activities: She goes to the gym 3-4 times a week for about an hour each time.  3. Primary care: Dr. London Pepper, [facility] [place]: Beth Fuller notes a small dark spot at her hairline at the left temporal area. She has no significant complaints related to any of her other body systems.  PHYSICAL EXAM: BP 123/85     HR: 78      Weight 133 lbs     She has gained 2 pounds in the past year.  Constitutional: The patient looks healthy and appears physically and emotionally well. She looks about the healthiest and happiest that I've ever seen her.  Head: She has what appears to be a seborrheic keratosis at the hairline above and lateral to her left orbit.  Eyes: There is no arcus or proptosis. Her right eye is no longer  more prominent than her left. Both are within the range of normal variation. Mouth: The oropharynx appears normal. The tongue appears normal. There is normal oral moisture. Neck: There are no bruits present. The thyroid gland is not enlarged. The consistency of the thyroid gland is normal. There is no thyroid tenderness to palpation.  Lungs: The lungs are clear. Air movement is good. Heart: The heart rhythm and rate appear normal. Heart sounds S1 and S2 are  normal. I do not appreciate any pathologic heart murmurs. Abdomen: The abdomen is normally enlarged for age.  Bowel sounds are normal. The abdomen is soft and non-tender. There is no obviously palpable hepatomegaly, splenomegaly, or other masses.  Arms: Muscle mass appears appropriate for age.  Hands: There is no obvious tremor. Phalangeal and metacarpophalangeal joints appear normal. Palms are normal. Legs: Muscle mass appears appropriate for age. There is no edema.  Neurologic: Muscle strength is normal for age and gender  in both the upper and the lower extremities. Muscle tone appears normal. Sensation to touch is normal in the legs.  Thyroid US 01/03/13: The dominant nodule in the right lobe was 1.5 x 2.0 x 2.3 cm, slightly smaller. Her left lobe nodule was 7 x 8 x 9 mm, also smaller.  Thyroid US 06/15/12: The dominant nodule in the right lobe is  2.7 x 2.0 x 1.8 cm vs 2.3 x 1.7 x 1.6, slightly larger than on 04/01/11. Left lobe nodule is 1.2 x 0.8 x 0.8 cm, stable in size. I reviewed her images with Dr. Laurence Ferrari in [place]. The [place] in 2012 cut across the nodule parallel  to the long axis of the nodule. The [place] this year cut across the nodule on a slight diagonal. It appears that the difference in technique probably accounts for the difference in measurements. Dr. Laurence Ferrari recommended repeating the [place] in 6 months.  LAB DATA:  Labs 02/17/14: TSH 1.862, free T4 1.61, free T3 2.8  Labs 02/21/13: TSH 1.036, free T4 1.14, free T3 2.4  Labs 09/21/12: TSH 1.970, free T4 1.44, free T3 2.6   Labs 06/14/12: TSH 1.675, free T4 1.37, free T3 2.4).  ASSESSMENT: 1. Hypothyroid, due in part to partial thyroidectomy and in part due to autoimmune destruction of thyrocytes: She is mid-range euthyroid on her current dose of Synthroid taken each evening. 2. Non-toxic multinodular goiter: Nodules were slightly smaller than before at her last [place] in 2014. Based on that finding, we decided we could safely  wait two years before repeating her [place]. Her next [place] will be in 2016.  She does not need a repeat FNA at this time.  3. Exophthalmos: This problem has resolved. I do not see any difference in her eyes today.  4. Hashimoto's disease: This problem is clinically quiescent. 5. Hypertension: As she exercises more in the warmer weather, her BP will probably normalize without medication. If not, however, she may need more treatment.   PLAN: 1. Repeat  thyroid US in one year. Repeat TFTs in one year.  2. Continue Synthroid at current dose. 3. Follow-up appointment in 12 months.  Level of Service: This visit lasted in excess of 45 minutes. More than 50% of the visit was devoted to counseling.  Sherrlyn Hock

## 2014-02-28 ENCOUNTER — Other Ambulatory Visit: Payer: Self-pay | Admitting: "Endocrinology

## 2014-09-19 ENCOUNTER — Other Ambulatory Visit: Payer: Self-pay | Admitting: "Endocrinology

## 2014-10-08 ENCOUNTER — Other Ambulatory Visit: Payer: Self-pay | Admitting: "Endocrinology

## 2014-11-04 ENCOUNTER — Other Ambulatory Visit: Payer: Self-pay | Admitting: "Endocrinology

## 2014-11-06 ENCOUNTER — Other Ambulatory Visit: Payer: Self-pay | Admitting: "Endocrinology

## 2015-01-30 ENCOUNTER — Other Ambulatory Visit: Payer: Self-pay | Admitting: *Deleted

## 2015-01-30 DIAGNOSIS — E890 Postprocedural hypothyroidism: Secondary | ICD-10-CM

## 2015-02-24 LAB — T4, FREE: Free T4: 1.6 ng/dL (ref 0.80–1.80)

## 2015-02-24 LAB — T3, FREE: T3 FREE: 2.6 pg/mL (ref 2.3–4.2)

## 2015-02-24 LAB — TSH: TSH: 1.479 u[IU]/mL (ref 0.350–4.500)

## 2015-02-28 ENCOUNTER — Encounter: Payer: Self-pay | Admitting: "Endocrinology

## 2015-02-28 ENCOUNTER — Ambulatory Visit
Admission: RE | Admit: 2015-02-28 | Discharge: 2015-02-28 | Disposition: A | Discharge status: Home / Self Care | Payer: 59 | Source: Ambulatory Visit | Attending: "Endocrinology | Admitting: "Endocrinology

## 2015-02-28 ENCOUNTER — Ambulatory Visit (INDEPENDENT_AMBULATORY_CARE_PROVIDER_SITE_OTHER): Payer: 59 | Admitting: "Endocrinology

## 2015-02-28 VITALS — BP 149/94 | HR 77 | Wt 122.0 lb

## 2015-02-28 DIAGNOSIS — E042 Nontoxic multinodular goiter: Secondary | ICD-10-CM

## 2015-02-28 DIAGNOSIS — E063 Autoimmune thyroiditis: Secondary | ICD-10-CM | POA: Diagnosis not present

## 2015-02-28 DIAGNOSIS — I10 Essential (primary) hypertension: Secondary | ICD-10-CM

## 2015-02-28 DIAGNOSIS — E890 Postprocedural hypothyroidism: Secondary | ICD-10-CM

## 2015-02-28 DIAGNOSIS — E349 Endocrine disorder, unspecified: Secondary | ICD-10-CM

## 2015-02-28 NOTE — Progress Notes (Signed)
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 Caucasian woman. She was unaccompanied.  1. Beth Fuller had a partial thyroidectomy for thyrotoxicosis associated with Graves' Disease about 1986. She had significant exophthalmos at that time. She remained euthyroid for about 10 years, then in about 1996 she became hypothyroid and had to begin Synthroid therapy. Since then she has had TFTs within the normal range on a dose of Synthroid, 75 mcg/day.   2. In early 2006 Beth Fuller developed pains in the posterior cervical spine radiating down her arms. An MRI on 05/18/05 showed multiple bony lesions, but also a 1.4 cm lesion of the right thyroid lobe, probably consistent with an adenoma. A fine needle aspiration of the nodule was performed on 07/21/05. This study showed abundant colloid and numerous follicles in sheets and balls, more consistent with adenomatous goiter than neoplasia.  3. I saw Beth Fuller in consultation on 08/26/05. She had just a slight prominence of her right eye as a residual effect of  her Graves ophthalmopathy. The lower portion of her right thyroid lobe was relatively more firm than the remainder of the gland. I subsequently reviewed her [place] in radiology and concurred with its findings. I also reviewed her path slides in pathology. I agreed with the pathologist that there was no evidence of atypia.TFTs showed that she was euthyroid on her Synthroid dose of 75 mcg per day. In May 2007 I ordered a repeat US. This time there was a suggestion that the dominant right lobe nodule and one other nodule might have grown slightly. I therefore ordered a repeat FNA. This study showed scattered groups of follicular epithelium with no atypia, more c/w adenomatous nodule than neoplasia. I again reviewed the  path slides with the pathologist and concurred with her conclusions. We continued to do serial [place] studies every 6 months through July 2009, which essentially remained unchanged. She had her third FNA on 07/28/08. I again reviewed those slides with the pathologist, who felt that the findings were most c/w a non-neoplastic goiter. I concurred. Since then we have performed serial [place] studies annually and later biennially, the most recent being 01/03/13. The dominant nodule and the other smaller nodules seemed to be a bit smaller over time. She has remained euthyroid on 75 mcg of Synthroid per day.    4. Beth Fuller last [facility] visit was on 02/22/14. In the interim she has been healthy.   A. She is still being treated with gabapentin for her cervical arthritis and radiculopathy. She reduced her gabapentin dose to twice daily because her neck is doing very well. She continues with home PT, to include home traction. The tingling and pain in her right lateral neck, right chest, and occasionally right arm have resolved.   B. She is eating healthier, with fewer carbs and little fried food. Her GI symptoms have resolved. She no longer needs to take Nexium. She remains on Synthroid, 75 mcg/day. She also remains on Lopressor, imipramine, and Klonopin.    5. Pertinent Review of Systems: Constitutional: The patient feels "good, very good". Energy level is good. She sleeps well.   Eyes: Vision is good. There are no significant eye complaints.Her exophthalmos has essentially resolved.  Neck: The patient has no complaints of anterior neck swelling, soreness, tenderness, pressure, discomfort, or difficulty swallowing.  Heart: Heart rate increases with exercise or  other physical activity. The patient has no complaints of palpitations, irregular heat beats, chest pain, or chest pressure. Gastrointestinal: Bowel movents seem normal. The patient has no complaints of excessive hunger, acid reflux, upset stomach, diarrhea, or  constipation. Legs: Muscle mass and strength seem normal. There are no complaints of numbness, tingling, burning, or pain. No edema is noted. Feet: There are no obvious foot problems. There are no complaints of numbness, tingling, burning, or pain. No edema is noted GYN: Her IUD was removed more than three years ago. Menses never returned.  She has hot flashes only about once a week now.  The hot flashes don't bother her much any more. Skin: She went to Dr. Salome Holmes for an evaluation of the dark spot at the left temporal area. He told her it was a benign keratosis.   PAST MEDICAL, FAMILY, AND SOCIAL HISTORY:  1. Work and family: She is still very busy at work and at home. 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 2. Activities: She goes to the gym 3-4 times a week for about an hour each time.  3. Primary care: Dr. London Pepper, [facility] [place]: There are no other significant items that are bothering Ms. Lieber at this time.   PHYSICAL EXAM: BP 149/94  BP at Dr. Darien Ramus office is usually 140/75. HR: 77   Weight 122 lbs   She has lost 11 pounds in the past year.  Constitutional: The patient really looks great. She looks about 10 years younger. She looks healthy and appears physically and emotionally well. Today is the best that I've ever seen her.  Head: She has what appears to be a seborrheic keratosis at the hairline above and lateral to her left orbit.  Eyes: There is no arcus or proptosis. Her right eye is no longer  more prominent than her left. Both are within the range of normal variation. Mouth: The oropharynx appears normal. The tongue appears normal. There is normal oral moisture. Neck: There are no bruits present. The thyroid gland is only slightly enlarged. The right lobe is normal in size. The left lobe is slightly enlarged. The consistency of the thyroid gland is normal. There is no thyroid tenderness to palpation.   Lungs: The lungs are clear. Air movement is good. Heart: The heart rhythm and rate appear normal. Heart sounds S1 and S2 are normal. I do not appreciate any pathologic heart murmurs. Abdomen: The abdomen is normally enlarged for age.  Bowel sounds are normal. The abdomen is soft and non-tender. There is no obviously palpable hepatomegaly, splenomegaly, or other masses.  Arms: Muscle mass appears appropriate for age.  Hands: There is no obvious tremor. Phalangeal and metacarpophalangeal joints appear normal. Palms are normal. Legs: Muscle mass appears appropriate for age. There is no edema.  Neurologic: Muscle strength is normal for age and gender  in both the upper and the lower extremities. Muscle tone appears normal. Sensation to touch is normal in the legs.  LAB DATA:  Labs 02/23/15: TSH 1.036, free T4 1.14, free T3 2.4  Labs 02/17/14: TSH 1.862, free T4 1.61, free T3 2.8  Labs 02/21/13: TSH 1.036, free T4 1.14, free T3 2.4  Labs 09/21/12: TSH 1.970, free T4 1.44, free T3 2.6   Labs 06/14/12: TSH 1.675, free T4 1.37, free T3 2.4).  IMAGING:  Thyroid US 01/03/13: The dominant nodule in the right lobe was 1.5 x 2.0  x 2.3 cm, slightly smaller. Her left lobe nodule was 7 x 8 x 9 mm, also smaller.  Thyroid US 06/15/12: The dominant nodule in the right lobe is  2.7 x 2.0 x 1.8 cm vs 2.3 x 1.7 x 1.6, slightly larger than on 04/01/11. Left lobe nodule is 1.2 x 0.8 x 0.8 cm, stable in size. I reviewed her images with Dr. Laurence Ferrari in [place]. The [place] in 2012 cut across the nodule parallel to the long axis of the nodule. The [place] this year cut across the nodule on a slight diagonal. It appears that the difference in technique probably accounts for the difference in measurements. Dr. Laurence Ferrari recommended repeating the [place] in 6 months.   ASSESSMENT: 1. Hypothyroid, due in part to partial thyroidectomy and due in part to autoimmune destruction of thyrocytes associated with Hashimoto's  disease. She is mid-range euthyroid on her current dose of Synthroid taken each evening. 2. Non-toxic multinodular goiter: Her thyroid gland is a bit larger on the left today. Nodules were slightly smaller than before at her last [place] in 2014. Based on that finding, we decided we could safely wait two years before repeating her [place]. It is time for her biennial [place]. After reviewing the [place] images and report, we'll determine whether or not she needs a repeat FNA this year.   3. Exophthalmos: This problem has resolved. I do not see any difference in her eyes today.  4. Hashimoto's disease: This problem is clinically quiescent. 5. Hypertension: Her BP is higher. As she exercises more in the warmer weather, her BP will probably normalize without medication. If not, however, she may need more treatment. She needs to follow up with Dr. Orland Mustard. She has an appointment with him next month.   PLAN: 1. Repeat  thyroid US now. Repeat TFTs in one year.  2. Continue Synthroid at current dose. 3. Follow-up appointment in 12 months.  Level of Service: This visit lasted in excess of 40 minutes. More than 50% of the visit was devoted to counseling.  Sherrlyn Hock

## 2015-02-28 NOTE — Patient Instructions (Signed)
Follow up visit in one year. Please repeat thyroid blood tests one week prior to next visit.

## 2015-03-05 ENCOUNTER — Encounter: Payer: Self-pay | Admitting: *Deleted

## 2015-06-21 ENCOUNTER — Other Ambulatory Visit: Payer: Self-pay | Admitting: "Endocrinology

## 2015-07-01 ENCOUNTER — Other Ambulatory Visit: Payer: Self-pay | Admitting: "Endocrinology

## 2015-10-09 ENCOUNTER — Other Ambulatory Visit: Payer: Self-pay | Admitting: Family Medicine

## 2015-10-09 DIAGNOSIS — R109 Unspecified abdominal pain: Principal | ICD-10-CM

## 2015-10-09 DIAGNOSIS — G8929 Other chronic pain: Secondary | ICD-10-CM

## 2015-11-02 ENCOUNTER — Ambulatory Visit
Admission: RE | Admit: 2015-11-02 | Discharge: 2015-11-02 | Disposition: A | Discharge status: Home / Self Care | Payer: 59 | Source: Ambulatory Visit | Attending: Family Medicine | Admitting: Family Medicine

## 2015-11-02 DIAGNOSIS — G8929 Other chronic pain: Secondary | ICD-10-CM

## 2015-11-02 DIAGNOSIS — R109 Unspecified abdominal pain: Principal | ICD-10-CM

## 2015-11-09 ENCOUNTER — Other Ambulatory Visit: Payer: Self-pay | Admitting: Family Medicine

## 2015-11-09 DIAGNOSIS — R109 Unspecified abdominal pain: Secondary | ICD-10-CM

## 2015-11-10 ENCOUNTER — Other Ambulatory Visit: Payer: Self-pay | Admitting: "Endocrinology

## 2015-11-14 ENCOUNTER — Ambulatory Visit
Admission: RE | Admit: 2015-11-14 | Discharge: 2015-11-14 | Disposition: A | Discharge status: Home / Self Care | Payer: 59 | Source: Ambulatory Visit | Attending: Family Medicine | Admitting: Family Medicine

## 2015-11-14 DIAGNOSIS — R109 Unspecified abdominal pain: Secondary | ICD-10-CM

## 2015-11-14 MED ORDER — IOPAMIDOL (ISOVUE-300) INJECTION 61%
100.0000 mL | Freq: Once | INTRAVENOUS | Status: AC | PRN
  Administered 2015-11-14: 100 mL via INTRAVENOUS

## 2016-01-24 ENCOUNTER — Other Ambulatory Visit: Payer: Self-pay | Admitting: Obstetrics & Gynecology

## 2016-01-25 LAB — CYTOLOGY - PAP

## 2016-02-26 ENCOUNTER — Other Ambulatory Visit: Payer: Self-pay | Admitting: *Deleted

## 2016-02-26 DIAGNOSIS — E042 Nontoxic multinodular goiter: Secondary | ICD-10-CM

## 2016-02-28 ENCOUNTER — Ambulatory Visit: Payer: 59 | Admitting: "Endocrinology

## 2016-02-28 LAB — TSH: TSH: 0.83 m[IU]/L

## 2016-02-28 LAB — T4, FREE: Free T4: 1.4 ng/dL (ref 0.8–1.8)

## 2016-02-28 LAB — T3, FREE: T3 FREE: 2.6 pg/mL (ref 2.3–4.2)

## 2016-03-03 ENCOUNTER — Ambulatory Visit (INDEPENDENT_AMBULATORY_CARE_PROVIDER_SITE_OTHER): Payer: BLUE CROSS/BLUE SHIELD | Admitting: "Endocrinology

## 2016-03-03 ENCOUNTER — Encounter: Payer: Self-pay | Admitting: "Endocrinology

## 2016-03-03 ENCOUNTER — Encounter: Payer: Self-pay | Admitting: *Deleted

## 2016-03-03 VITALS — BP 122/84 | HR 72 | Wt 130.6 lb

## 2016-03-03 DIAGNOSIS — E042 Nontoxic multinodular goiter: Secondary | ICD-10-CM | POA: Diagnosis not present

## 2016-03-03 DIAGNOSIS — E349 Endocrine disorder, unspecified: Secondary | ICD-10-CM

## 2016-03-03 DIAGNOSIS — W57XXXA Bitten or stung by nonvenomous insect and other nonvenomous arthropods, initial encounter: Secondary | ICD-10-CM | POA: Diagnosis not present

## 2016-03-03 DIAGNOSIS — T148 Other injury of unspecified body region: Secondary | ICD-10-CM

## 2016-03-03 DIAGNOSIS — S70362A Insect bite (nonvenomous), left thigh, initial encounter: Secondary | ICD-10-CM | POA: Diagnosis not present

## 2016-03-03 DIAGNOSIS — H0520 Unspecified exophthalmos: Secondary | ICD-10-CM

## 2016-03-03 DIAGNOSIS — E063 Autoimmune thyroiditis: Secondary | ICD-10-CM

## 2016-03-03 DIAGNOSIS — I10 Essential (primary) hypertension: Secondary | ICD-10-CM

## 2016-03-03 DIAGNOSIS — E890 Postprocedural hypothyroidism: Secondary | ICD-10-CM | POA: Diagnosis not present

## 2016-03-03 DIAGNOSIS — S70372A Other superficial bite of left thigh, initial encounter: Secondary | ICD-10-CM | POA: Diagnosis not present

## 2016-03-03 DIAGNOSIS — H1045 Other chronic allergic conjunctivitis: Secondary | ICD-10-CM | POA: Diagnosis not present

## 2016-03-03 NOTE — Progress Notes (Signed)
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 Caucasian lady. She was unaccompanied.  1. Beth Fuller had a partial thyroidectomy for thyrotoxicosis associated with Graves' Disease about 1986. She had significant exophthalmos at that time. She remained euthyroid for about 10 years, then in about 1996 she became hypothyroid and had to begin Synthroid therapy. Since then she has had TFTs within the normal range on a Synthroid dose of 75 mcg/day.   2. In early 2006 Beth Fuller developed pains in the posterior cervical spine radiating down her arms. An MRI on 05/18/05 showed multiple bony lesions, but also a 1.4 cm lesion of the right thyroid lobe, probably consistent with an adenoma. A fine needle aspiration of the nodule was performed on 07/21/05. This study showed abundant colloid and numerous follicles in sheets and balls, more consistent with adenomatous goiter than neoplasia.  3. I saw Beth Fuller in consultation on 08/26/05. She had just a slight prominence of her right eye as a residual effect of  her Graves ophthalmopathy. The lower portion of her right thyroid lobe was relatively more firm than the remainder of the gland. I subsequently reviewed her [place] in radiology and concurred with its findings. I also reviewed her path slides in pathology. I agreed with the pathologist that there was no evidence of atypia. TFTs showed that she was euthyroid on her Synthroid dose of 75 mcg per day. In May 2007 I ordered a repeat US. This time there was a suggestion that the dominant right lobe nodule and one other nodule might have grown slightly. I therefore ordered a repeat FNA. This study showed scattered groups of follicular epithelium with no atypia, more c/w adenomatous nodule than neoplasia. I again reviewed the  path slides with the pathologist and concurred with her conclusions. We continued to do serial [place] studies every 6 months through July 2009, which essentially remained unchanged. She had her third FNA on 07/28/08. I again reviewed those slides with the pathologist, who felt that the findings were most c/w a non-neoplastic goiter. I concurred. Since then we have performed serial [place] studies annually and later biennially, the most recent being 02/28/15. The dominant nodule and the other smaller nodules seemed to have become a bit smaller over time. She has remained euthyroid on 75 mcg of Synthroid per day.    4. Beth Fuller last [facility] visit was on 02/27/14. In the interim she has been healthy.   A. She developed [facility] spotted Fever in July 2016. She has "never fully hit my stride since then." She has not been physically active since then and has regained some of the weight that she had lost.    B. She found a tick on her on her left upper, medial thigh last week.   C. Otherwise she has had an uneventful year.   D. She is still being treated with gabapentin for her cervical arthritis and radiculopathy. She reduced her gabapentin dose to twice daily because her neck is doing very well. She continues with home PT, to include home traction. The tingling and pain in her right lateral neck, right chest, and occasionally right arm have resolved.   E. She is trying to eat fairly healthy, with fewer carbs and little fried food. Her GI symptoms have resolved.   F. She remains on Synthroid, 75 mcg/day. She also remains  on Lopressor, imipramine, and Klonopin.  G. She is doing "okay" emotionally. "I'm pretty even and stable."    5. Pertinent Review of Systems: Constitutional: The patient feels "okay, very fortunate for my age". "My energy level is good, but not as good as it was at this time last year." She sleeps well.   Eyes: Vision is good. There are no significant eye complaints.Her exophthalmos has  resolved.  Neck: The patient has no complaints of anterior neck swelling, soreness, tenderness, pressure, discomfort, or difficulty swallowing.  Heart: Heart rate increases with exercise or other physical activity. The patient has no complaints of palpitations, irregular heat beats, chest pain, or chest pressure. Gastrointestinal: Bowel movents seem normal most of the time. The patient has no complaints of excessive hunger, acid reflux, upset stomach, diarrhea, or constipation. Legs: As above. Muscle mass and strength seem normal. There are no complaints of numbness, tingling, burning, or pain. No edema is noted. Feet: There are no obvious foot problems. There are no complaints of numbness, tingling, burning, or pain. No edema is noted GYN: Her IUD was removed more than three years ago. Menses never returned.  She rarely has hot flashes now.   Skin: She went to Dr. Salome Holmes for an evaluation of the dark spot at the left temporal area. He told her it was a benign keratosis. He will see her annually.  PAST MEDICAL, FAMILY, AND SOCIAL HISTORY:  1. Work and family: She is still very busy at work and at home. 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 She does not have any grandchildren yet, but does have "granddog" that she frequently cares for.  2. Activities: She goes to the gym 1-2 times a week for about an hour each time.  3. Primary care: Dr. London Pepper, [facility] [place]: There are no other significant items that are bothering Beth Fuller at this time.   PHYSICAL EXAM: BP 122/84 today. HR: 72   She has gained 8 pounds. Weight 130 lbs, 9 oz.   Constitutional: The patient looks good. She still looks about 10 years younger than her chronologic age. She looks healthy and appears physically and emotionally well. Her affect and insight are very normal.  Head: She has a seborrheic keratosis at the hairline above and lateral to her left  orbit. The keratosis appears to be smaller today.  Eyes: There is no arcus or proptosis. Her right eye is no longer  more prominent than her left. Both are within the range of normal variation. Mouth: The oropharynx appears normal. The tongue appears normal. There is normal oral moisture. Neck: There are no bruits present. The thyroid gland has shrunk back down to being  normal in size today. The consistency of the thyroid gland is normal. There is no thyroid tenderness to palpation.  Lungs: The lungs are clear. Air movement is good. Heart: The heart rhythm and rate appear normal. Heart sounds S1 and S2 are normal. I do not appreciate any pathologic heart murmurs. Abdomen: The abdomen is normally enlarged for age.  Bowel sounds are normal. The abdomen is soft and non-tender. There is no obviously palpable hepatomegaly, splenomegaly, or other masses.  Arms: Muscle mass appears appropriate for age.  Hands: There is no obvious tremor. Phalangeal and metacarpophalangeal joints appear normal. Palms are normal. Legs: She has about a 4 x 3 cm area of red papules and induration at the site of  the tick bite. Her left inguinal nodes are not enlarged. Muscle mass appears appropriate for age. There is no edema.  Neurologic: Muscle strength is normal for age and gender  in both the upper and the lower extremities. Muscle tone appears normal. Sensation to touch is normal in the legs.  LAB DATA:  Labs 4/04/7: TSH 0.83, free T4 1.4, free T3 2.6  Labs 02/23/15: TSH 1.036, free T4 1.14, free T3 2.4  Labs 02/17/14: TSH 1.862, free T4 1.61, free T3 2.8  Labs 02/21/13: TSH 1.036, free T4 1.14, free T3 2.4  Labs 09/21/12: TSH 1.970, free T4 1.44, free T3 2.6   Labs 06/14/12: TSH 1.675, free T4 1.37, free T3 2.4).  IMAGING:  Thyroid US 02/28/15: Right lobe measured 3.3 x 2.2 x 1.6 cm (stable size). Stable dominant central nodule which is partially solid and cystic has stable morphology and measures approximately  2.2 x 2.0 x 1.5 cm. Left lobe measures 3.7 x 1.0 x 1.1 cm (stable size). Stable vague 10 mm area of nodularity in the superior left lobe. Stable hypoechoic nodule near the juncture of the left lobe and isthmus measures 0.4 cm. Stable cyst in the inferior left lobe measures 0.3 cm. No nodules visualized.   Thyroid US 01/03/13: The dominant nodule in the right lobe was 1.5 x 2.0 x 2.3 cm, compared with 1.8 x 2.0 x 2.7 previously. Her left lobe nodule was 7 x 8 x 9 mm, compared with 8 x 8 x 12 previously. A 3 x 4 x 4 solid nodule was also sen in the mid-left thyroid lobe.  Thyroid US 06/15/12: The dominant nodule in the right lobe is  2.7 x 2.0 x 1.8 cm vs 2.3 x 1.7 x 1.6, slightly larger than on 04/01/11. Left lobe nodule is 1.2 x 0.8 x 0.8 cm, stable in size. I reviewed her images with Dr. Laurence Ferrari in [place]. The [place] in 2012 cut across the nodule parallel to the long axis of the nodule. The [place] this year cut across the nodule on a slight diagonal. It appears that the difference in technique probably accounts for the difference in measurements. Dr. Laurence Ferrari recommended repeating the [place] in 6 months.   ASSESSMENT: 1. Hypothyroid, due in part to partial thyroidectomy and due in part to autoimmune destruction of thyrocytes associated with Hashimoto's disease: She is currently at about the 75% of the true normal thyroid hormone range on her current dose of Synthroid taken each evening. 2. Non-toxic multinodular goiter:   A. Her thyroid gland is smaller today. I can't feel any nodules today.   B. At her last visit in 2016 we had a thyroid US performed. That study showed the dominant central right thyroid lobe nodule which appeared to be a bit smaller and did not show any abnormal morphological characteristics. No significant nodularity was seen on the left. Given the benign course of her NTMNG, I feel that we can safely wait to repeat her thyroid US until 2019. She does not need a repeat FNA at this  time.    3. Exophthalmos: This problem has resolved. I do not see any difference in her eyes today.  4. Hashimoto's disease: This problem is clinically quiescent. 5. Hypertension: Her systolic BP is lower, but her diastolic BP is higher. The mild increase in BP is due to her weight gain and to the decrease in physical activity that she has had.  If she exercises more in the warmer weather, her BP will probably  normalize without medication. If not, however, she may need more treatment. She needs to follow up with Dr. Orland Mustard.  6. Recent tick bite and inflammation: Given her history of developing RMSF in July 2016 and her recent tick bite, I asked her to call Dr. Orland Mustard and schedule a visit with him. Dr. Orland Mustard may want to start antibiotics now.   PLAN: 1. Repeat  TFTs in one year. Repeat thyroid US in 2019. 2. Continue Synthroid at current dose. 3. Follow-up appointment in 12 months. 4. Please call Dr. Orland Mustard today and obtain an appointment with him about the tick bite.   Level of Service: This visit lasted in excess of 40 minutes. More than 50% of the visit was devoted to counseling.  Sherrlyn Hock

## 2016-03-03 NOTE — Patient Instructions (Signed)
Follow up visit in one year. Please repeat thyroid blood tests one week prior to next visit.  

## 2016-04-08 DIAGNOSIS — L309 Dermatitis, unspecified: Secondary | ICD-10-CM | POA: Diagnosis not present

## 2016-04-08 DIAGNOSIS — D239 Other benign neoplasm of skin, unspecified: Secondary | ICD-10-CM | POA: Diagnosis not present

## 2016-05-06 DIAGNOSIS — E039 Hypothyroidism, unspecified: Secondary | ICD-10-CM | POA: Diagnosis not present

## 2016-05-06 DIAGNOSIS — E559 Vitamin D deficiency, unspecified: Secondary | ICD-10-CM | POA: Diagnosis not present

## 2016-05-06 DIAGNOSIS — I10 Essential (primary) hypertension: Secondary | ICD-10-CM | POA: Diagnosis not present

## 2016-05-06 DIAGNOSIS — E785 Hyperlipidemia, unspecified: Secondary | ICD-10-CM | POA: Diagnosis not present

## 2016-05-17 DIAGNOSIS — Z23 Encounter for immunization: Secondary | ICD-10-CM | POA: Diagnosis not present

## 2016-07-21 DIAGNOSIS — N3000 Acute cystitis without hematuria: Secondary | ICD-10-CM | POA: Diagnosis not present

## 2016-07-24 ENCOUNTER — Other Ambulatory Visit: Payer: Self-pay | Admitting: "Endocrinology

## 2016-08-27 ENCOUNTER — Other Ambulatory Visit: Payer: Self-pay | Admitting: "Endocrinology

## 2016-09-25 ENCOUNTER — Other Ambulatory Visit: Payer: Self-pay | Admitting: "Endocrinology

## 2016-10-30 ENCOUNTER — Other Ambulatory Visit: Payer: Self-pay | Admitting: "Endocrinology

## 2016-10-31 DIAGNOSIS — Z23 Encounter for immunization: Secondary | ICD-10-CM | POA: Diagnosis not present

## 2016-11-13 DIAGNOSIS — Z0000 Encounter for general adult medical examination without abnormal findings: Secondary | ICD-10-CM | POA: Diagnosis not present

## 2016-11-13 DIAGNOSIS — E785 Hyperlipidemia, unspecified: Secondary | ICD-10-CM | POA: Diagnosis not present

## 2016-11-13 DIAGNOSIS — Z1211 Encounter for screening for malignant neoplasm of colon: Secondary | ICD-10-CM | POA: Diagnosis not present

## 2016-11-13 DIAGNOSIS — E559 Vitamin D deficiency, unspecified: Secondary | ICD-10-CM | POA: Diagnosis not present

## 2016-11-27 ENCOUNTER — Other Ambulatory Visit: Payer: Self-pay | Admitting: "Endocrinology

## 2016-12-15 DIAGNOSIS — I10 Essential (primary) hypertension: Secondary | ICD-10-CM | POA: Diagnosis not present

## 2016-12-25 ENCOUNTER — Other Ambulatory Visit: Payer: Self-pay | Admitting: "Endocrinology

## 2017-01-06 DIAGNOSIS — H16042 Marginal corneal ulcer, left eye: Secondary | ICD-10-CM | POA: Diagnosis not present

## 2017-01-08 DIAGNOSIS — H16042 Marginal corneal ulcer, left eye: Secondary | ICD-10-CM | POA: Diagnosis not present

## 2017-01-12 DIAGNOSIS — H16042 Marginal corneal ulcer, left eye: Secondary | ICD-10-CM | POA: Diagnosis not present

## 2017-01-23 ENCOUNTER — Other Ambulatory Visit: Payer: Self-pay | Admitting: "Endocrinology

## 2017-02-22 ENCOUNTER — Other Ambulatory Visit: Payer: Self-pay | Admitting: "Endocrinology

## 2017-03-13 DIAGNOSIS — L309 Dermatitis, unspecified: Secondary | ICD-10-CM | POA: Diagnosis not present

## 2017-03-25 ENCOUNTER — Other Ambulatory Visit: Payer: Self-pay | Admitting: "Endocrinology

## 2017-04-14 ENCOUNTER — Other Ambulatory Visit (INDEPENDENT_AMBULATORY_CARE_PROVIDER_SITE_OTHER): Payer: Self-pay | Admitting: *Deleted

## 2017-04-14 DIAGNOSIS — E034 Atrophy of thyroid (acquired): Secondary | ICD-10-CM

## 2017-04-22 DIAGNOSIS — M7138 Other bursal cyst, other site: Secondary | ICD-10-CM | POA: Diagnosis not present

## 2017-04-22 DIAGNOSIS — D229 Melanocytic nevi, unspecified: Secondary | ICD-10-CM | POA: Diagnosis not present

## 2017-04-22 DIAGNOSIS — L300 Nummular dermatitis: Secondary | ICD-10-CM | POA: Diagnosis not present

## 2017-04-22 DIAGNOSIS — L821 Other seborrheic keratosis: Secondary | ICD-10-CM | POA: Diagnosis not present

## 2017-04-26 ENCOUNTER — Other Ambulatory Visit: Payer: Self-pay | Admitting: "Endocrinology

## 2017-05-11 DIAGNOSIS — I10 Essential (primary) hypertension: Secondary | ICD-10-CM | POA: Diagnosis not present

## 2017-05-11 DIAGNOSIS — F410 Panic disorder [episodic paroxysmal anxiety] without agoraphobia: Secondary | ICD-10-CM | POA: Diagnosis not present

## 2017-05-11 DIAGNOSIS — E039 Hypothyroidism, unspecified: Secondary | ICD-10-CM | POA: Diagnosis not present

## 2017-05-13 DIAGNOSIS — E034 Atrophy of thyroid (acquired): Secondary | ICD-10-CM | POA: Diagnosis not present

## 2017-05-14 LAB — T4, FREE: Free T4: 1.6 ng/dL (ref 0.8–1.8)

## 2017-05-14 LAB — T3, FREE: T3, Free: 2.6 pg/mL (ref 2.3–4.2)

## 2017-05-14 LAB — TSH: TSH: 1.56 mIU/L

## 2017-05-20 ENCOUNTER — Encounter (INDEPENDENT_AMBULATORY_CARE_PROVIDER_SITE_OTHER): Payer: Self-pay | Admitting: "Endocrinology

## 2017-05-20 ENCOUNTER — Ambulatory Visit (INDEPENDENT_AMBULATORY_CARE_PROVIDER_SITE_OTHER): Payer: BLUE CROSS/BLUE SHIELD | Admitting: "Endocrinology

## 2017-05-20 VITALS — BP 132/84 | HR 76 | Wt 132.6 lb

## 2017-05-20 DIAGNOSIS — E063 Autoimmune thyroiditis: Secondary | ICD-10-CM | POA: Diagnosis not present

## 2017-05-20 DIAGNOSIS — E049 Nontoxic goiter, unspecified: Secondary | ICD-10-CM

## 2017-05-20 DIAGNOSIS — I10 Essential (primary) hypertension: Secondary | ICD-10-CM

## 2017-05-20 DIAGNOSIS — E890 Postprocedural hypothyroidism: Secondary | ICD-10-CM | POA: Diagnosis not present

## 2017-05-20 DIAGNOSIS — E042 Nontoxic multinodular goiter: Secondary | ICD-10-CM

## 2017-05-20 NOTE — Progress Notes (Signed)
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 Caucasian lady. She was unaccompanied.  1. Beth Fuller had a partial thyroidectomy for thyrotoxicosis associated with Graves' Disease about 1986. She had significant exophthalmos at that time. She remained euthyroid for about 10 years, then in about 1996 she became hypothyroid and had to begin Synthroid therapy. Since then she has had TFTs within the normal range on a Synthroid dose of 75 mcg/day.   2. In early 2006 Beth Fuller developed pains in the posterior cervical spine radiating down her arms. An MRI on 05/18/05 showed multiple bony lesions, but also a 1.4 cm lesion of the right thyroid lobe, probably consistent with an adenoma. A fine needle aspiration of the nodule was performed on 07/21/05. This study showed abundant colloid and numerous follicles in sheets and balls, more consistent with adenomatous goiter than neoplasia.  3. I saw Beth Fuller in consultation on 08/26/05. She had just a slight prominence of her right eye as a residual effect of  her Graves ophthalmopathy. The lower portion of her right thyroid lobe was relatively more firm than the remainder of the gland. I subsequently reviewed her [place] in radiology and concurred with its findings. I also reviewed her path slides in pathology. I agreed with the pathologist that there was no evidence of atypia. TFTs showed that she was euthyroid on her Synthroid dose of 75 mcg per day. In May 2007 I ordered a repeat US. This time there was a suggestion that the dominant right lobe nodule and one other nodule might have grown slightly. I therefore ordered a repeat FNA. This study showed scattered groups of follicular epithelium with no atypia, more c/w adenomatous nodule than neoplasia. I again reviewed the  path slides with the pathologist and concurred with her conclusions. We continued to do serial [place] studies every 6 months through July 2009, which essentially remained unchanged. She had her third FNA on 07/28/08. I again reviewed those slides with the pathologist, who felt that the findings were most c/w a non-neoplastic goiter. I concurred. Since then we have performed serial [place] studies annually and later biennially, the most recent being 02/28/15. The dominant nodule and the other smaller nodules seemed to have become a bit smaller over time. She has remained euthyroid on 75 mcg of Synthroid per day.    4. Beth Fuller last [facility] visit was on 03/03/16. In the interim she has been healthy.   A. She developed [facility] spotted Fever in July 2016. She feels much better than she did last year. Her fatigue has resolved. Her energy level is good. She has been more physically active since then, is going back to the gym at least once a week, and is walking again.   B. She found a tick on her on her left upper, medial thigh the week before her last visit, but she never developed any problems.    C. She has been healthy in the past year, is not on any new medications, and has not discontinued any medications.    D. She is still being treated with gabapentin for her cervical arthritis and radiculopathy. She reduced her gabapentin dose to twice daily because her neck is doing very well. She continues with home PT, to include home traction if she needs it, but has not had to do traction for quite some  time.  She no longer needs to do her cervical stretching exercises very often.   E. She is trying to eat fairly healthy, with fewer carbs and little fried food. Her GI symptoms have resolved.   F. She remains on Synthroid, 75 mcg/day. She also remains on Lopressor, imipramine, and Klonopin.  G. She is doing "okay" emotionally. She is having some work-related stress now due to her office moving.     5. Pertinent  Review of Systems: Constitutional: The patient feels "good". "I do really well compared to many of my friends." She sleeps well.   Eyes: Vision is good. There are no significant eye complaints.Her exophthalmos has resolved.  Neck: The patient has no complaints of anterior neck swelling, soreness, tenderness, pressure, discomfort, or difficulty swallowing.  Heart: Heart rate increases with exercise or other physical activity. The patient has no complaints of palpitations, irregular heat beats, chest pain, or chest pressure. Gastrointestinal: Bowel movents seem normal most of the time. The patient has no complaints of excessive hunger, acid reflux, upset stomach, diarrhea, or constipation. Legs: As above. Muscle mass and strength seem normal. There are no complaints of numbness, tingling, burning, or pain. No edema is noted. Feet: There are no obvious foot problems. There are no complaints of numbness, tingling, burning, or pain. No edema is noted GYN: Her IUD was removed more than four years ago. Menses never returned.  She rarely has hot flashes now, not anything major.   Skin: She went to Dr. Salome Holmes prior to her last visit for an evaluation of the dark spot at the left temporal area. He told her it was a benign keratosis. He saw her again in May 2018. No new skin problems were diagnosed. Marland Kitchen  PAST MEDICAL, FAMILY, AND SOCIAL HISTORY:  1. Work and family: She is still very busy at work and at home. 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 She does not have any grandchildren yet, but does have "granddog" that she frequently cares for.  2. Activities: She goes to the gym 1-2 times a week for about an hour each time.  3. Primary care: Dr. London Pepper, [facility] [place]: There are no other significant items that are bothering Beth Fuller at this time.   PHYSICAL EXAM: BP 132/84 today. HR: 76   Weight 132 pounds and 9.6 ounces. She has  gained 2 pounds in the past year.    Constitutional: The patient looks really good. She still looks about 10 years younger than her chronologic age. She looks healthy and appears physically and emotionally well. Her affect and insight are very normal.  Head: Normocephalic Eyes: There is no arcus or proptosis. Her right eye is no longer  more prominent than her left. Both are within the range of normal. Mouth: The oropharynx appears normal. The tongue appears normal. There is normal oral moisture. Neck: There are no bruits present. The thyroid gland is essentially normal in size today, but perhaps a slight bit enlarged on the left today. The consistency of the thyroid gland is normal. There is no thyroid tenderness to palpation.  Lungs: The lungs are clear. Air movement is good. Heart: The heart rhythm and rate appear normal. Heart sounds S1 and S2 are normal. I do not appreciate any pathologic heart murmurs. Abdomen: The abdomen is normally enlarged for age.  Bowel sounds are normal. The abdomen is soft and non-tender. There is no  obviously palpable hepatomegaly, splenomegaly, or other masses.  Arms: Muscle mass appears appropriate for age.  Hands: There is no obvious tremor. Phalangeal and metacarpophalangeal joints appear normal. Palms are normal. Legs: Muscle mass appears appropriate for age. There is no edema.  Neurologic: Muscle strength is normal for age and gender  in both the upper and the lower extremities. Muscle tone appears normal. Sensation to touch is normal in the legs.  LAB DATA:  Labs 05/13/17: TH 1.56, free T4 1.6, free T3 2.6  Labs 4/04/7: TSH 0.83, free T4 1.4, free T3 2.6  Labs 02/23/15: TSH 1.036, free T4 1.14, free T3 2.4  Labs 02/17/14: TSH 1.862, free T4 1.61, free T3 2.8  Labs 02/21/13: TSH 1.036, free T4 1.14, free T3 2.4  Labs 09/21/12: TSH 1.970, free T4 1.44, free T3 2.6   Labs 06/14/12: TSH 1.675, free T4 1.37, free T3 2.4).  IMAGING:  Thyroid US 02/28/15:  Right lobe measured 3.3 x 2.2 x 1.6 cm (stable size). Stable dominant central nodule which is partially solid and cystic has stable morphology and measures approximately 2.2 x 2.0 x 1.5 cm. Left lobe measures 3.7 x 1.0 x 1.1 cm (stable size). Stable vague 10 mm area of nodularity in the superior left lobe. Stable hypoechoic nodule near the juncture of the left lobe and isthmus measures 0.4 cm. Stable cyst in the inferior left lobe measures 0.3 cm. No nodules visualized.   Thyroid US 01/03/13: The dominant nodule in the right lobe was 1.5 x 2.0 x 2.3 cm, compared with 1.8 x 2.0 x 2.7 previously. Her left lobe nodule was 7 x 8 x 9 mm, compared with 8 x 8 x 12 previously. A 3 x 4 x 4 solid nodule was also sen in the mid-left thyroid lobe.  Thyroid US 06/15/12: The dominant nodule in the right lobe is  2.7 x 2.0 x 1.8 cm vs 2.3 x 1.7 x 1.6, slightly larger than on 04/01/11. Left lobe nodule is 1.2 x 0.8 x 0.8 cm, stable in size. I reviewed her images with Dr. Laurence Ferrari in [place]. The [place] in 2012 cut across the nodule parallel to the long axis of the nodule. The [place] this year cut across the nodule on a slight diagonal. It appears that the difference in technique probably accounts for the difference in measurements. Dr. Laurence Ferrari recommended repeating the [place] in 6 months.   ASSESSMENT: 1. Hypothyroid, due in part to partial thyroidectomy and due in part to autoimmune destruction of thyrocytes associated with Hashimoto's disease: She is currently at about the 50% of the true normal thyroid hormone range on her current dose of Synthroid taken each evening. 2. Non-toxic multinodular goiter:   A. Her thyroid gland is perhaps a bit larger today. I can't feel any nodules today.   B. At her last visit in 2016 we had a thyroid US performed. That study showed the dominant central right thyroid lobe nodule which appeared to be a bit smaller and did not show any abnormal morphological characteristics. No  significant nodularity was seen on the left. Given the benign course of her NTMNG, and the fact that her thyroid gland had not increased ins size, I felt at her last visit  that we could safely wait to repeat her thyroid US until 2019. Since her thyroid gland may have increased a bit ins size since then, however, it is prudent to repeat the [place]. She does not need a repeat FNA at this time.  3. Exophthalmos: This problem has resolved. I do not see any difference in her eyes today.  4. Hashimoto's disease: This problem is clinically quiescent. 5. Hypertension: Her systolic BP is lower, but her diastolic BP is higher today. When she saw her PCP recently,  her BP was not elevated. If she exercises more, her BP will probably normalize without additional medication. If not, however, she may need more treatment. She needs to follow up with Dr. Orland Mustard.   PLAN: 1. Repeat  TFTs in one year. Repeat thyroid US soon,. 2. Continue Synthroid at current dose. 3. Follow-up appointment in 12 months. 4. Please call Dr. Orland Mustard to set up an appointment in 1-2 months to follow up on her BP. today.   Level of Service: This visit lasted in excess of 40 minutes. More than 50% of the visit was devoted to counseling.  Tillman Sers, MD, CDE Adult and Pediatric Endocrinology

## 2017-05-20 NOTE — Patient Instructions (Signed)
Follow up visit in one year. Please repeat thyroid tests one week prior.

## 2017-05-24 ENCOUNTER — Other Ambulatory Visit: Payer: Self-pay | Admitting: "Endocrinology

## 2017-06-05 DIAGNOSIS — R319 Hematuria, unspecified: Secondary | ICD-10-CM | POA: Diagnosis not present

## 2017-06-05 DIAGNOSIS — N390 Urinary tract infection, site not specified: Secondary | ICD-10-CM | POA: Diagnosis not present

## 2017-06-15 ENCOUNTER — Ambulatory Visit
Admission: RE | Admit: 2017-06-15 | Discharge: 2017-06-15 | Disposition: A | Discharge status: Home / Self Care | Payer: BLUE CROSS/BLUE SHIELD | Source: Ambulatory Visit | Attending: "Endocrinology | Admitting: "Endocrinology

## 2017-06-15 DIAGNOSIS — E042 Nontoxic multinodular goiter: Secondary | ICD-10-CM | POA: Diagnosis not present

## 2017-06-21 ENCOUNTER — Other Ambulatory Visit (INDEPENDENT_AMBULATORY_CARE_PROVIDER_SITE_OTHER): Payer: Self-pay | Admitting: "Endocrinology

## 2017-07-15 ENCOUNTER — Other Ambulatory Visit: Payer: Self-pay | Admitting: Dermatology

## 2017-07-15 DIAGNOSIS — D492 Neoplasm of unspecified behavior of bone, soft tissue, and skin: Secondary | ICD-10-CM | POA: Diagnosis not present

## 2017-07-23 ENCOUNTER — Other Ambulatory Visit (INDEPENDENT_AMBULATORY_CARE_PROVIDER_SITE_OTHER): Payer: Self-pay | Admitting: "Endocrinology

## 2017-08-27 ENCOUNTER — Other Ambulatory Visit (INDEPENDENT_AMBULATORY_CARE_PROVIDER_SITE_OTHER): Payer: Self-pay | Admitting: "Endocrinology

## 2017-09-03 DIAGNOSIS — H2513 Age-related nuclear cataract, bilateral: Secondary | ICD-10-CM | POA: Diagnosis not present

## 2017-10-30 DIAGNOSIS — Z23 Encounter for immunization: Secondary | ICD-10-CM | POA: Diagnosis not present

## 2017-10-30 DIAGNOSIS — M542 Cervicalgia: Secondary | ICD-10-CM | POA: Diagnosis not present

## 2017-10-30 DIAGNOSIS — E559 Vitamin D deficiency, unspecified: Secondary | ICD-10-CM | POA: Diagnosis not present

## 2017-10-30 DIAGNOSIS — I10 Essential (primary) hypertension: Secondary | ICD-10-CM | POA: Diagnosis not present

## 2017-11-30 DIAGNOSIS — Z0000 Encounter for general adult medical examination without abnormal findings: Secondary | ICD-10-CM | POA: Diagnosis not present

## 2017-11-30 DIAGNOSIS — E559 Vitamin D deficiency, unspecified: Secondary | ICD-10-CM | POA: Diagnosis not present

## 2017-12-14 DIAGNOSIS — R05 Cough: Secondary | ICD-10-CM | POA: Diagnosis not present

## 2017-12-14 DIAGNOSIS — J029 Acute pharyngitis, unspecified: Secondary | ICD-10-CM | POA: Diagnosis not present

## 2018-02-18 ENCOUNTER — Telehealth (INDEPENDENT_AMBULATORY_CARE_PROVIDER_SITE_OTHER): Payer: Self-pay | Admitting: "Endocrinology

## 2018-02-18 DIAGNOSIS — E049 Nontoxic goiter, unspecified: Secondary | ICD-10-CM

## 2018-02-18 NOTE — Telephone Encounter (Signed)
1. I called the patient to discuss her last thyroid US report. She had already seen the report in MyChart and knew that there was not any change in the nodule size. 2. I reviewed the report for her. The radiologist noted that the nodule has remained at essentially the same size since 2006, to include [place] studies in 2013, 2014, and 2016. We have performed three fine needle aspirates on this nodule in the past, most recently in 2009.  3. I told her that I will put in an order now for her thyroid blood tests to be done one week prior to her next visit.  Tillman Sers, MD, CDE

## 2018-03-25 ENCOUNTER — Other Ambulatory Visit (INDEPENDENT_AMBULATORY_CARE_PROVIDER_SITE_OTHER): Payer: Self-pay | Admitting: "Endocrinology

## 2018-04-22 ENCOUNTER — Other Ambulatory Visit (INDEPENDENT_AMBULATORY_CARE_PROVIDER_SITE_OTHER): Payer: Self-pay | Admitting: "Endocrinology

## 2018-04-22 DIAGNOSIS — E049 Nontoxic goiter, unspecified: Secondary | ICD-10-CM | POA: Diagnosis not present

## 2018-04-23 LAB — T4, FREE: FREE T4: 1.3 ng/dL (ref 0.8–1.8)

## 2018-04-23 LAB — T3, FREE: T3 FREE: 2.7 pg/mL (ref 2.3–4.2)

## 2018-04-23 LAB — TSH: TSH: 2.14 m[IU]/L (ref 0.40–4.50)

## 2018-04-26 ENCOUNTER — Ambulatory Visit (INDEPENDENT_AMBULATORY_CARE_PROVIDER_SITE_OTHER): Payer: BLUE CROSS/BLUE SHIELD | Admitting: "Endocrinology

## 2018-04-26 ENCOUNTER — Encounter (INDEPENDENT_AMBULATORY_CARE_PROVIDER_SITE_OTHER): Payer: Self-pay | Admitting: "Endocrinology

## 2018-04-26 VITALS — BP 124/76 | HR 90 | Wt 138.0 lb

## 2018-04-26 DIAGNOSIS — R9389 Abnormal findings on diagnostic imaging of other specified body structures: Secondary | ICD-10-CM

## 2018-04-26 DIAGNOSIS — I10 Essential (primary) hypertension: Secondary | ICD-10-CM | POA: Diagnosis not present

## 2018-04-26 DIAGNOSIS — E063 Autoimmune thyroiditis: Secondary | ICD-10-CM | POA: Diagnosis not present

## 2018-04-26 DIAGNOSIS — E049 Nontoxic goiter, unspecified: Secondary | ICD-10-CM | POA: Diagnosis not present

## 2018-04-26 DIAGNOSIS — E890 Postprocedural hypothyroidism: Secondary | ICD-10-CM

## 2018-04-26 MED ORDER — SYNTHROID 88 MCG PO TABS: 88.0000 ug | ORAL_TABLET | Freq: Every day | ORAL | 11 refills | Status: DC

## 2018-04-26 NOTE — Progress Notes (Signed)
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 Caucasian lady. She was unaccompanied.  1. Beth Fuller had a partial thyroidectomy for thyrotoxicosis associated with Graves' Disease about 1986. She had significant exophthalmos at that time. She remained euthyroid for about 10 years, then in about 1996 she became hypothyroid and had to begin Synthroid therapy. Since then she has had TFTs within the normal range on a Synthroid dose of 75 mcg/day.   2. In early 2006 Beth Fuller developed pains in the posterior cervical spine radiating down her arms. An MRI on 05/18/05 showed multiple bony lesions, but also a 1.4 cm lesion of the right thyroid lobe, probably consistent with an adenoma. A fine needle aspiration of the nodule was performed on 07/21/05. This study showed abundant colloid and numerous follicles in sheets and balls, more consistent with adenomatous goiter than neoplasia.  3. I saw Beth Fuller in consultation on 08/26/05. She had just a slight prominence of her right eye as a residual effect of  her Graves ophthalmopathy. The lower portion of her right thyroid lobe was relatively more firm than the remainder of the gland. I subsequently reviewed her [place] in radiology and concurred with its findings. I also reviewed her path slides in pathology. I agreed with the pathologist that there was no evidence of atypia. TFTs showed that she was euthyroid on her Synthroid dose of 75 mcg per day. In May 2007 I ordered a repeat US. This time there was a suggestion that the dominant right lobe nodule and one other nodule might have grown slightly. I therefore ordered a repeat FNA. This study showed scattered groups of follicular epithelium with no atypia, more c/w adenomatous nodule than neoplasia. I again reviewed the  path slides with the pathologist and concurred with her conclusions. We continued to do serial [place] studies every 6 months through July 2009, which essentially remained unchanged. She had her third FNA on 07/28/08. I again reviewed those slides with the pathologist, who felt that the findings were most c/w a non-neoplastic goiter. I concurred. Since then we have performed serial [place] studies annually and later biennially, the most recent being 06/15/17. The dominant nodule has remained essentially unchanged. The smaller nodules seemed to have become a bit smaller over time. She has remained euthyroid on 75 mcg of Synthroid per day.    4. Beth Fuller last [facility] visit was on 05/20/17. In the interim she has been healthy.   A. She developed [facility] spotted Fever in July 2016, but has since recovered nicely. She feels "really good". Her fatigue has resolved. Her energy level is good. She has been more physically active and is going back to the gym 2-3 times per week. She also walks her dog about 3 times per week. .    B. She has been healthy in the past year, except for an elevation in her BP. She now takes HCTZ each morning.  She remains on Synthroid, 75 mcg/day. She also remains on Lopressor, imipramine, gabapentin, and Klonopin.  C. She is still being treated with gabapentin for her cervical arthritis and radiculopathy. She reduced her gabapentin dose to twice daily because her neck is doing very well. She continues with home PT, to include home traction if she needs it, but has not had to do traction for quite some time.  She no longer  needs to do her cervical stretching exercises very often.   D. She is trying to eat fairly healthy, with fewer carbs and little fried food. Her GI symptoms have resolved.   E. She is doing OK emotionally and mentally. She is now working from home, so her job stress has significantly decreased.     5. Pertinent Review of Systems: Constitutional: The patient feels "good".  She sleeps well.   Eyes: Vision is good. There are no significant eye complaints. Her exophthalmos has resolved.  Neck: The patient has no complaints of anterior neck swelling, soreness, tenderness, pressure, discomfort, or difficulty swallowing.  Heart: Heart rate increases with exercise or other physical activity. The patient has no complaints of palpitations, irregular heat beats, chest pain, or chest pressure. Gastrointestinal: She occasionally has an upset stomach, usually only when she is stressed. Bowel movents seem normal most of the time. The patient has no complaints of excessive hunger, acid reflux, diarrhea, or constipation. Legs: Muscle mass and strength seem normal. There are no complaints of numbness, tingling, burning, or pain. No edema is noted. Feet: There are no obvious foot problems. There are no complaints of numbness, tingling, burning, or pain. No edema is noted GYN: Her IUD was removed in late 2011. Menses never returned.  She rarely has hot flashes now, not anything major.   Skin: She saw  Dr. Salome Holmes in dermatology for an evaluation of the dark spot at the left temporal area. He told her it was a benign keratosis. He saw her again in about July 2018. She will have an annual follow up visit soon.    PAST MEDICAL, FAMILY, AND SOCIAL HISTORY:  1. 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 She does not have any grandchildren yet, but does have "granddog" that she frequently cares for.  2. Activities: She goes to the gym 2-3 times a week for about an hour each time.  3. Primary care: Dr. London Pepper, [facility] [place]: There are no other significant items that are bothering Beth Fuller at this time.   PHYSICAL EXAM: BP 124/76 today. HR: 90   Weight 138 pounds. She has gained 6 pounds in the past year. She thinks that she has more muscle. Her clothes are not tighter.   Constitutional: The patient looks really good. She still looks about 10 years younger than her chronologic age. She looks healthy and appears physically and emotionally well. Her affect and insight are very normal.  Head: Normocephalic Eyes: There is no arcus or proptosis. Her right eye is no longer  more prominent than her left. Both are within the range of normal. Mouth: The oropharynx appears normal. The tongue appears normal. There is normal oral moisture. Neck: There are no bruits present. The thyroid gland is a bit smaller and is normal in size today. The consistency of the thyroid gland is normal. There is no thyroid tenderness to palpation.  Lungs: The lungs are clear. Air movement is good. Heart: The heart rhythm and rate appear normal. Heart sounds S1 and S2 are normal. I do not appreciate any pathologic heart murmurs. Abdomen: The abdomen is more enlarged for age.  Bowel sounds are normal. The abdomen is soft and non-tender. There is no obviously palpable hepatomegaly, splenomegaly, or other masses.  Arms: Muscle mass appears appropriate for age.  Hands: There is no obvious tremor.  Phalangeal and metacarpophalangeal joints appear normal. Palms are normal. Legs: Muscle mass appears appropriate for age. There is no edema.  Neurologic: Muscle strength is normal for age and gender  in both the upper and the lower extremities. Muscle tone appears normal. Sensation to touch is normal in the legs.  LAB DATA:  Labs 04/22/18: TSH 2.14, free T4 1.3, free T3 2.7  Labs 05/13/17: TSH 1.56, free T4 1.6, free T3 2.6  Labs 4/04/7: TSH 0.83, free T4 1.4, free T3 2.6  Labs 02/23/15: TSH 1.036, free T4 1.14, free T3 2.4  Labs 02/17/14: TSH 1.862, free T4 1.61, free T3 2.8  Labs 02/21/13: TSH 1.036, free T4 1.14, free T3 2.4  Labs 09/21/12: TSH 1.970, free T4 1.44, free T3 2.6   Labs 06/14/12: TSH 1.675, free T4 1.37, free T3 2.4).  IMAGING:  Thyroid US 06/15/17: Echotexture was moderately  heterogeneous. Right lobe measured 3.4 x. 2.1 x 1.9 cm. Left lobe measured 3.8 x 1.1 x 0.9 cm. The lobes were a very small amount larger. She had a solitary 2.3 x 1.9 x 1.6 cm complex, mostly solid nodule with macrocalcifications.The size and characteristics of the nodule were essentially unchanged.   Thyroid US 02/28/15: Right lobe measured 3.3 x 2.2 x 1.6 cm (stable size). Stable dominant central nodule which is partially solid and cystic has stable morphology and measures approximately 2.2 x 2.0 x 1.5 cm. Left lobe measures 3.7 x 1.0 x 1.1 cm (stable size). Stable vague 10 mm area of nodularity in the superior left lobe. Stable hypoechoic nodule near the juncture of the left lobe and isthmus measures 0.4 cm. Stable cyst in the inferior left lobe measures 0.3 cm. No nodules visualized.   Thyroid US 01/03/13: The dominant nodule in the right lobe was 1.5 x 2.0 x 2.3 cm, compared with 1.8 x 2.0 x 2.7 previously. Her left lobe nodule was 7 x 8 x 9 mm, compared with 8 x 8 x 12 previously. A 3 x 4 x 4 solid nodule was also sen in the mid-left thyroid lobe.  Thyroid US 06/15/12: The dominant nodule in the right lobe is  2.7 x 2.0 x 1.8 cm vs 2.3 x 1.7 x 1.6, slightly larger than on 04/01/11. Left lobe nodule is 1.2 x 0.8 x 0.8 cm, stable in size. I reviewed her images with Dr. Laurence Ferrari in [place]. The [place] in 2012 cut across the nodule parallel to the long axis of the nodule. The [place] this year cut across the nodule on a slight diagonal. It appears that the difference in technique probably accounts for the difference in measurements. Dr. Laurence Ferrari recommended repeating the [place] in 6 months.   ASSESSMENT: 1. Hypothyroid, due in part to partial thyroidectomy and due in part to autoimmune destruction of thyrocytes associated with Hashimoto's disease:   A. In the past year her TFTs have decreased. She is currently at about the 30% of the true normal thyroid hormone range on her current dose of Synthroid taken  each evening. She may not be absorbing the Synthroid as well as she did before. Or, she may have lost more thyrocytes and be producing less LT4 on her own.   B. It is reasonable to increase her Synthroid dose to 88 mcg/day and re-check her TFTs in 3 months and again prior to her next visit.  2. Non-toxic nodular goiter/abnormal thyroid US:   A. Her thyroid gland is smaller today.  I can't feel any nodules today.  B. After her last visit in 2018 we had a thyroid US performed. That study showed the dominant central right thyroid lobe nodule which appeared to be about the same size and did not show any abnormal morphological characteristics. No significant nodularity was seen on the left.  C. Given the benign course of her NTNG, and the fact that her thyroid gland has actually decreased in size a bit, I feel that we can safely wait to repeat her thyroid US until 2020.  She does not need a repeat FNA at this time.   3. Exophthalmos: This problem has resolved. I do not see any difference in her eyes today.  4. Hashimoto's disease: This problem is clinically quiescent. The waxing and waning of thyroid gland size is c/w evolving Hashimoto's disease.  5. Hypertension: Her BP is good today. The HCTZ prescribed by Dr. Orland Mustard has been beneficial. .   PLAN: 1. Diagnostic: Repeat  TFTs in 3 months and again in one year. Repeat thyroid US in one year. . 2. Therapeutic: Increase Synthroid to 88 mcg/day.  3. Patient education: We discussed all of the above at great length. Beth Fuller always asks good questions.  4. Follow-up appointment in 12 months.  Level of Service: This visit lasted in excess of 50 minutes. More than 50% of the visit was devoted to counseling.  Tillman Sers, MD, CDE Adult and Pediatric Endocrinology

## 2018-05-10 ENCOUNTER — Other Ambulatory Visit (INDEPENDENT_AMBULATORY_CARE_PROVIDER_SITE_OTHER): Payer: Self-pay

## 2018-05-10 DIAGNOSIS — E890 Postprocedural hypothyroidism: Secondary | ICD-10-CM

## 2018-05-11 ENCOUNTER — Other Ambulatory Visit (INDEPENDENT_AMBULATORY_CARE_PROVIDER_SITE_OTHER): Payer: Self-pay | Admitting: *Deleted

## 2018-05-11 DIAGNOSIS — E890 Postprocedural hypothyroidism: Secondary | ICD-10-CM

## 2018-05-11 MED ORDER — SYNTHROID 88 MCG PO TABS: 88.0000 ug | ORAL_TABLET | Freq: Every day | ORAL | 1 refills | Status: DC

## 2018-05-31 ENCOUNTER — Ambulatory Visit
Admission: RE | Admit: 2018-05-31 | Discharge: 2018-05-31 | Disposition: A | Discharge status: Home / Self Care | Payer: BLUE CROSS/BLUE SHIELD | Source: Ambulatory Visit | Attending: "Endocrinology | Admitting: "Endocrinology

## 2018-05-31 DIAGNOSIS — E041 Nontoxic single thyroid nodule: Secondary | ICD-10-CM | POA: Diagnosis not present

## 2018-06-03 DIAGNOSIS — M542 Cervicalgia: Secondary | ICD-10-CM | POA: Diagnosis not present

## 2018-06-03 DIAGNOSIS — E559 Vitamin D deficiency, unspecified: Secondary | ICD-10-CM | POA: Diagnosis not present

## 2018-06-03 DIAGNOSIS — I10 Essential (primary) hypertension: Secondary | ICD-10-CM | POA: Diagnosis not present

## 2018-06-03 DIAGNOSIS — E039 Hypothyroidism, unspecified: Secondary | ICD-10-CM | POA: Diagnosis not present

## 2018-06-10 ENCOUNTER — Encounter (INDEPENDENT_AMBULATORY_CARE_PROVIDER_SITE_OTHER): Payer: Self-pay | Admitting: *Deleted

## 2018-07-09 DIAGNOSIS — K5792 Diverticulitis of intestine, part unspecified, without perforation or abscess without bleeding: Secondary | ICD-10-CM | POA: Diagnosis not present

## 2018-08-13 DIAGNOSIS — Z01818 Encounter for other preprocedural examination: Secondary | ICD-10-CM | POA: Diagnosis not present

## 2018-09-16 DIAGNOSIS — H2513 Age-related nuclear cataract, bilateral: Secondary | ICD-10-CM | POA: Diagnosis not present

## 2018-10-03 ENCOUNTER — Other Ambulatory Visit (INDEPENDENT_AMBULATORY_CARE_PROVIDER_SITE_OTHER): Payer: Self-pay | Admitting: "Endocrinology

## 2018-10-03 DIAGNOSIS — E890 Postprocedural hypothyroidism: Secondary | ICD-10-CM

## 2018-10-04 DIAGNOSIS — E890 Postprocedural hypothyroidism: Secondary | ICD-10-CM | POA: Diagnosis not present

## 2018-10-04 LAB — T3, FREE: T3, Free: 2.5 pg/mL (ref 2.3–4.2)

## 2018-10-04 LAB — T4, FREE: Free T4: 1.2 ng/dL (ref 0.8–1.8)

## 2018-10-04 LAB — TSH: TSH: 1.01 mIU/L (ref 0.40–4.50)

## 2018-10-06 ENCOUNTER — Encounter (INDEPENDENT_AMBULATORY_CARE_PROVIDER_SITE_OTHER): Payer: Self-pay | Admitting: *Deleted

## 2018-10-12 DIAGNOSIS — K640 First degree hemorrhoids: Secondary | ICD-10-CM | POA: Diagnosis not present

## 2018-10-12 DIAGNOSIS — Z8371 Family history of colonic polyps: Secondary | ICD-10-CM | POA: Diagnosis not present

## 2018-10-12 DIAGNOSIS — D126 Benign neoplasm of colon, unspecified: Secondary | ICD-10-CM | POA: Diagnosis not present

## 2018-10-12 DIAGNOSIS — K635 Polyp of colon: Secondary | ICD-10-CM | POA: Diagnosis not present

## 2018-10-15 DIAGNOSIS — K635 Polyp of colon: Secondary | ICD-10-CM | POA: Diagnosis not present

## 2018-10-15 DIAGNOSIS — D126 Benign neoplasm of colon, unspecified: Secondary | ICD-10-CM | POA: Diagnosis not present

## 2018-11-10 DIAGNOSIS — E039 Hypothyroidism, unspecified: Secondary | ICD-10-CM | POA: Diagnosis not present

## 2018-11-10 DIAGNOSIS — I10 Essential (primary) hypertension: Secondary | ICD-10-CM | POA: Diagnosis not present

## 2018-12-01 DIAGNOSIS — D229 Melanocytic nevi, unspecified: Secondary | ICD-10-CM | POA: Diagnosis not present

## 2018-12-01 DIAGNOSIS — B081 Molluscum contagiosum: Secondary | ICD-10-CM | POA: Diagnosis not present

## 2018-12-06 DIAGNOSIS — Z0000 Encounter for general adult medical examination without abnormal findings: Secondary | ICD-10-CM | POA: Diagnosis not present

## 2018-12-06 DIAGNOSIS — I10 Essential (primary) hypertension: Secondary | ICD-10-CM | POA: Diagnosis not present

## 2018-12-06 DIAGNOSIS — E785 Hyperlipidemia, unspecified: Secondary | ICD-10-CM | POA: Diagnosis not present

## 2018-12-06 DIAGNOSIS — E559 Vitamin D deficiency, unspecified: Secondary | ICD-10-CM | POA: Diagnosis not present

## 2018-12-30 ENCOUNTER — Other Ambulatory Visit: Payer: Self-pay | Admitting: Dermatology

## 2018-12-30 DIAGNOSIS — B081 Molluscum contagiosum: Secondary | ICD-10-CM | POA: Diagnosis not present

## 2018-12-30 DIAGNOSIS — D485 Neoplasm of uncertain behavior of skin: Secondary | ICD-10-CM | POA: Diagnosis not present

## 2019-02-09 DIAGNOSIS — L821 Other seborrheic keratosis: Secondary | ICD-10-CM | POA: Diagnosis not present

## 2019-02-09 DIAGNOSIS — B081 Molluscum contagiosum: Secondary | ICD-10-CM | POA: Diagnosis not present

## 2019-02-17 DIAGNOSIS — J029 Acute pharyngitis, unspecified: Secondary | ICD-10-CM | POA: Diagnosis not present

## 2019-02-17 DIAGNOSIS — M26629 Arthralgia of temporomandibular joint, unspecified side: Secondary | ICD-10-CM | POA: Diagnosis not present

## 2019-02-23 DIAGNOSIS — J029 Acute pharyngitis, unspecified: Secondary | ICD-10-CM | POA: Diagnosis not present

## 2019-04-05 ENCOUNTER — Other Ambulatory Visit: Payer: Self-pay | Admitting: Family Medicine

## 2019-04-05 ENCOUNTER — Ambulatory Visit
Admission: RE | Admit: 2019-04-05 | Discharge: 2019-04-05 | Disposition: A | Discharge status: Home / Self Care | Payer: BLUE CROSS/BLUE SHIELD | Source: Ambulatory Visit | Attending: Family Medicine | Admitting: Family Medicine

## 2019-04-05 DIAGNOSIS — K5792 Diverticulitis of intestine, part unspecified, without perforation or abscess without bleeding: Secondary | ICD-10-CM

## 2019-04-05 DIAGNOSIS — R1030 Lower abdominal pain, unspecified: Secondary | ICD-10-CM | POA: Diagnosis not present

## 2019-04-05 DIAGNOSIS — R109 Unspecified abdominal pain: Secondary | ICD-10-CM | POA: Diagnosis not present

## 2019-04-05 DIAGNOSIS — R197 Diarrhea, unspecified: Secondary | ICD-10-CM | POA: Diagnosis not present

## 2019-04-05 MED ORDER — IOPAMIDOL (ISOVUE-300) INJECTION 61%
100.0000 mL | Freq: Once | INTRAVENOUS | Status: AC | PRN
  Administered 2019-04-05: 100 mL via INTRAVENOUS

## 2019-04-19 DIAGNOSIS — R197 Diarrhea, unspecified: Secondary | ICD-10-CM | POA: Diagnosis not present

## 2019-04-19 DIAGNOSIS — R194 Change in bowel habit: Secondary | ICD-10-CM | POA: Diagnosis not present

## 2019-04-19 DIAGNOSIS — R933 Abnormal findings on diagnostic imaging of other parts of digestive tract: Secondary | ICD-10-CM | POA: Diagnosis not present

## 2019-04-21 DIAGNOSIS — R197 Diarrhea, unspecified: Secondary | ICD-10-CM | POA: Diagnosis not present

## 2019-04-26 DIAGNOSIS — Z01419 Encounter for gynecological examination (general) (routine) without abnormal findings: Secondary | ICD-10-CM | POA: Diagnosis not present

## 2019-04-26 DIAGNOSIS — Z6823 Body mass index (BMI) 23.0-23.9, adult: Secondary | ICD-10-CM | POA: Diagnosis not present

## 2019-04-26 DIAGNOSIS — Z124 Encounter for screening for malignant neoplasm of cervix: Secondary | ICD-10-CM | POA: Diagnosis not present

## 2019-04-26 DIAGNOSIS — Z1231 Encounter for screening mammogram for malignant neoplasm of breast: Secondary | ICD-10-CM | POA: Diagnosis not present

## 2019-06-13 DIAGNOSIS — E039 Hypothyroidism, unspecified: Secondary | ICD-10-CM | POA: Diagnosis not present

## 2019-06-13 DIAGNOSIS — I10 Essential (primary) hypertension: Secondary | ICD-10-CM | POA: Diagnosis not present

## 2019-06-13 DIAGNOSIS — G4700 Insomnia, unspecified: Secondary | ICD-10-CM | POA: Diagnosis not present

## 2019-09-26 DIAGNOSIS — Z20828 Contact with and (suspected) exposure to other viral communicable diseases: Secondary | ICD-10-CM | POA: Diagnosis not present

## 2019-10-17 ENCOUNTER — Other Ambulatory Visit: Payer: Self-pay

## 2019-10-17 DIAGNOSIS — Z20822 Contact with and (suspected) exposure to covid-19: Secondary | ICD-10-CM

## 2019-10-18 DIAGNOSIS — U071 COVID-19: Secondary | ICD-10-CM | POA: Diagnosis not present

## 2019-10-19 LAB — NOVEL CORONAVIRUS, NAA: SARS-CoV-2, NAA: DETECTED — AB

## 2019-10-25 ENCOUNTER — Other Ambulatory Visit (INDEPENDENT_AMBULATORY_CARE_PROVIDER_SITE_OTHER): Payer: Self-pay | Admitting: "Endocrinology

## 2019-10-25 DIAGNOSIS — E890 Postprocedural hypothyroidism: Secondary | ICD-10-CM | POA: Diagnosis not present

## 2019-10-26 LAB — T3, FREE: T3, Free: 2.9 pg/mL (ref 2.3–4.2)

## 2019-10-26 LAB — TSH: TSH: 2.09 mIU/L (ref 0.40–4.50)

## 2019-10-26 LAB — T4, FREE: Free T4: 1.5 ng/dL (ref 0.8–1.8)

## 2019-11-08 ENCOUNTER — Encounter (INDEPENDENT_AMBULATORY_CARE_PROVIDER_SITE_OTHER): Payer: Self-pay | Admitting: "Endocrinology

## 2019-11-08 ENCOUNTER — Ambulatory Visit (INDEPENDENT_AMBULATORY_CARE_PROVIDER_SITE_OTHER): Payer: BC Managed Care – PPO | Admitting: "Endocrinology

## 2019-11-08 ENCOUNTER — Other Ambulatory Visit: Payer: Self-pay

## 2019-11-08 VITALS — BP 116/76 | HR 72 | Wt 148.6 lb

## 2019-11-08 DIAGNOSIS — E042 Nontoxic multinodular goiter: Secondary | ICD-10-CM

## 2019-11-08 DIAGNOSIS — H0520 Unspecified exophthalmos: Secondary | ICD-10-CM

## 2019-11-08 DIAGNOSIS — E063 Autoimmune thyroiditis: Secondary | ICD-10-CM | POA: Diagnosis not present

## 2019-11-08 DIAGNOSIS — I10 Essential (primary) hypertension: Secondary | ICD-10-CM

## 2019-11-08 DIAGNOSIS — E890 Postprocedural hypothyroidism: Secondary | ICD-10-CM

## 2019-11-08 NOTE — Patient Instructions (Signed)
Follow up visit in one year. Please repeat lab tests in 3 months and again 1-2 weeks prior to next visit.

## 2019-11-08 NOTE — Progress Notes (Signed)
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 Caucasian lady. She was unaccompanied.  1. Beth Fuller had her initial endocrine consultation visit with me on 08/26/2005:  A. Beth Fuller had a partial thyroidectomy for thyrotoxicosis associated with Graves' Disease on about 1986. She had significant exophthalmos at that time. She remained euthyroid for about 10 years, then in about 1996 she became hypothyroid and had to begin Synthroid therapy. Since then she had had TFTs within the normal range on a Synthroid dose of 75 mcg/day.   B. In early 2006 Beth. Daw developed pains in the posterior cervical spine radiating down her arms. An MRI on 05/18/05 showed multiple bony lesions, but also a 1.4 cm lesion of the right thyroid lobe, probably consistent with an adenoma. Athyroid [place] study performed on 06/02/05 showed that her right lobe measured 3.5 x 1.6 x 1.4 cm. There was a 1.4 x 1.4 x 1.7 cm complex nodule in the right lower pole. The left lobe measured 0.9 x 0.9 x 1.8 cm. A fine needle aspiration of the nodule was performed on 07/21/05. This study showed abundant colloid and numerous follicles in sheets and balls, more consistent with adenomatous goiter than neoplasia.  C. When I saw Beth Fuller at that initial visit, she had just a slight prominence of her right eye as a residual effect of  her Graves ophthalmopathy. The lower portion of her right thyroid lobe was relatively more firm than the remainder of the gland. I subsequently reviewed her [place] in radiology and concurred with its findings. I also reviewed her path slides in pathology. I agreed with the pathologist that there was no evidence of atypia. TFTs showed that she was euthyroid on her Synthroid dose of 75 mcg per day. In May 2007 I ordered a repeat US.  This time there was a suggestion that the dominant right lobe nodule and one other nodule might have grown slightly. I therefore ordered a repeat FNA. This study showed scattered groups of follicular epithelium with no atypia, more c/w adenomatous nodule than neoplasia. I again reviewed the path slides with the pathologist and concurred with her conclusions. We continued to do serial [place] studies every 6 months through July 2009, which essentially remained unchanged. She had her third FNA on 07/28/08. I again reviewed those slides with the pathologist, who felt that the findings were most c/w a non-neoplastic goiter. I concurred. Since then we have performed serial [place] studies annually and later biennially, the most recent being 05/31/18. The dominant nodule has remained essentially unchanged. The smaller nodules seemed to have become a bit smaller over time. She has remained euthyroid on 75 mcg of Synthroid per day.    4. Beth Fuller last [facility] visit was on 04/26/18. At that visit I increase her Synthroid dose to 88 mcg/day.  A. In the interim she has been healthy, but had covid-19 in November with very mild URI symptoms and sleepiness. She has recovered nicely. Her energy level is pretty good. She walks her dog about 3 times per week. .    B. She now takes HCTZ each morning.  She remains on Synthroid, 88 mcg/day. She also remains on Lopressor, imipramine, gabapentin, and Klonopin.  C. She is not having any cervical arthritis and radiculopathy.   D. She had an episode of colitis and hematotchezia in about February 2020.  E. She is trying to eat fairly healthy, with fewer carbs and little fried food. Her GI symptoms have resolved.   F. She is doing 'fine" emotionally and mentally. She is still working from home, so her job stress has significantly decreased.     5. Pertinent Review of Systems: Constitutional: The patient feels "pretty good". "I don't have any aches and pains." She sleeps well.   Eyes:  Vision is good. There are no significant eye complaints. Her exophthalmos has resolved.  Neck: The patient has occasionally had the sensation of some prominence in her anterior neck, but no complaints of anterior neck soreness, tenderness, pressure, discomfort, or difficulty swallowing.  Heart: Heart rate increases with exercise or other physical activity. The patient has no complaints of palpitations, irregular heat beats, chest pain, or chest pressure. Gastrointestinal: As above. She occasionally has an upset stomach, usually only when she is stressed. Bowel movents seem normal most of the time. The patient has no complaints of excessive hunger, acid reflux, diarrhea, or constipation. Legs: Muscle mass and strength seem normal. There are no complaints of numbness, tingling, burning, or pain. No edema is noted. Feet: There are no obvious foot problems. There are no complaints of numbness, tingling, burning, or pain. No edema is noted GYN: Her IUD was removed in late 2011. Menses never returned.  She occasionally has hot flushes, not anything major.   Skin: She saw  Dr. Salome Holmes in dermatology for an evaluation of the dark spot at the left temporal area. He told her it was a benign keratosis. He saw her again in about July 2018 and in February 2020. She will have an annual follow up visit soon.    PAST MEDICAL, FAMILY, AND SOCIAL HISTORY:  1. 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 She does not have any grandchildren yet, but does have "granddog" that she frequently cares for.  2. Activities: She now does pilates about 4 times per week.   3. Primary care: Dr. London Pepper, [facility] [place]: There are no other significant items that are bothering Beth Fuller at this time.   PHYSICAL EXAM:  BP 116/76   Pulse 72   Wt 148 lb 9.6 oz (67.4 kg)   Growth percentile SmartLinks can only be  used for patients less than 79 years old.  Wt Readings from Last 3 Encounters:  11/08/19 148 lb 9.6 oz (67.4 kg)  04/26/18 138 lb (62.6 kg)  05/20/17 132 lb 9.6 oz (60.1 kg)    Ht Readings from Last 3 Encounters:  No data found for Ht    HC Readings from Last 3 Encounters:  No data found for [facility]   Facility age limit for growth percentiles is 20 years.  There is no height or weight on file to calculate BMI. No height and weight on file for this encounter.  There is no height or weight on file to calculate BSA.  Constitutional: The patient looks really good. She looks healthy and appears physically and emotionally well. Her affect and insight are very normal.  Head: Normocephalic Eyes: There is no arcus or proptosis. Her right eye is no longer  more prominent than her left. Both are within the range of normal. Mouth: The oropharynx appears normal. The tongue appears normal. There is normal oral moisture. Neck: There are no bruits present. The thyroid gland is  normal in size again today, to include the isthmus. The consistency of the thyroid gland is normal. There is no thyroid tenderness to palpation. When I palpated her isthmus, however, she said that this is the area in which she has felt prominence/pressure at times recently.  Lungs: The lungs are clear. Air movement is good. Heart: The heart rhythm and rate appear normal. Heart sounds S1 and S2 are normal. I do not appreciate any pathologic heart murmurs. Abdomen: The abdomen is more enlarged.  Bowel sounds are normal. The abdomen is soft and non-tender. There is no obviously palpable hepatomegaly, splenomegaly, or other masses.  Arms: Muscle mass appears appropriate for age.  Hands: There is no obvious tremor. Phalangeal and metacarpophalangeal joints appear normal. Palms are normal. Legs: Muscle mass appears appropriate for age. There is no edema.  Neurologic: Muscle strength is normal for age and gender  in both the upper and the  lower extremities. Muscle tone appears normal. Sensation to touch is normal in the legs.  LAB DATA:  Labs 10/25/19: TSH 2.09, free T4 1.5, free T3 2.9  Labs 10/04/18: TSH 1.01, free T4 1.2, free T3 2.5  Labs 04/22/18: TSH 2.14, free T4 1.3, free T3 2.7  Labs 05/13/17: TSH 1.56, free T4 1.6, free T3 2.6  Labs 4/04/7: TSH 0.83, free T4 1.4, free T3 2.6  Labs 02/23/15: TSH 1.036, free T4 1.14, free T3 2.4  Labs 02/17/14: TSH 1.862, free T4 1.61, free T3 2.8  Labs 02/21/13: TSH 1.036, free T4 1.14, free T3 2.4  Labs 09/21/12: TSH 1.970, free T4 1.44, free T3 2.6   Labs 06/14/12: TSH 1.675, free T4 1.37, free T3 2.4).  IMAGING:  Thyroid US 05/31/18: The parenchymal echotexture is markedly heterogenous. She has a 2.3 x 2.1 x 2.1 cm nodule in the right lobe that is essentially unchanged since 2014 or before. The impression was confirmed 5-year stability. No further follow up was required.     Thyroid US 06/15/17: Echotexture was moderately heterogeneous. Right lobe measured 3.4 x. 2.1 x 1.9 cm. Left lobe measured 3.8 x 1.1 x 0.9 cm. The lobes were a very small amount larger. She had a solitary 2.3 x 1.9 x 1.6 cm complex, mostly solid nodule with macrocalcifications.The size and characteristics of the nodule were essentially unchanged.   Thyroid US 02/28/15: Right lobe measured 3.3 x 2.2 x 1.6 cm (stable size). Stable dominant central nodule which is partially solid and cystic has stable morphology and measures approximately 2.2 x 2.0 x 1.5 cm. Left lobe measures 3.7 x 1.0 x 1.1 cm (stable size). Stable vague 10 mm area of nodularity in the superior left lobe. Stable hypoechoic nodule near the juncture of the left lobe and isthmus measures 0.4 cm. Stable cyst in the inferior left lobe measures 0.3 cm. No nodules visualized.   Thyroid US 01/03/13: The dominant nodule in the right lobe was 1.5 x 2.0 x 2.3 cm, compared with 1.8 x 2.0 x 2.7 previously. Her left lobe nodule was 7 x 8 x 9 mm, compared with  8 x 8 x 12 previously. A 3 x 4 x 4 solid nodule was also sen in the mid-left thyroid lobe.  Thyroid US 06/15/12: The dominant nodule in the right lobe is  2.7 x 2.0 x 1.8 cm vs 2.3 x 1.7 x 1.6, slightly larger than on 04/01/11. Left lobe nodule is 1.2 x 0.8 x 0.8 cm, stable in size. I reviewed her images with Dr. Laurence Ferrari in [place].  The [place] in 2012 cut across the nodule parallel to the long axis of the nodule. The [place] this year cut across the nodule on a slight diagonal. It appears that the difference in technique probably accounts for the difference in measurements. Dr. Laurence Ferrari recommended repeating the [place] in 6 months.   ASSESSMENT: 1. Hypothyroid, due in part to partial thyroidectomy and due in part to autoimmune destruction of thyrocytes associated with Hashimoto's disease:   A. In the past year her TFTs have remained euthyroid on her current Synthroid dose of 88 mcg/day.   B. She is currently at about the 30% of the true normal thyroid hormone range on her current dose of Synthroid taken each evening.   C. Her recent set of TFTs in December 2020 was abnormal, in that all three of the TFTs had increased in parallel together. The shift of all three TFTs in parallel together, upward or downward, is pathognomonic for an interim flare up of thyroiditis.   2. Non-toxic nodular goiter/abnormal thyroid US:   A. Her thyroid gland is within normal size again today. I can't feel any nodules today.   B. After her last visit in 2019 we had a thyroid US performed. That study showed the dominant central right thyroid lobe nodule which appeared to be about the same size and did not show any abnormal morphological characteristics. No significant nodularity was seen on the left. After at least 5 years of stability, the radiologic assessment was that no further follow up was required.   C. Given the benign course of her NTNG, and the fact that her thyroid gland has actually shrunk back to normal size in  the past three years, I agree that we can safely defer further [place] studies unless she has more symptoms in her anterior neck. Her recent symptoms involving the isthmus occurred simultaneously with the recent flare up of thyroiditis and did not involve her right thyroid lobe, so I am not concerned about these recent symptoms. However, I asked her to contact me if she has any symptoms in the right lobe area. She does not need a repeat FNA at this time.   3. Exophthalmos: This problem has resolved. I do not see any difference in her eyes today.  4. Hashimoto's disease: This problem is clinically quiescent, but has been chemically active since her last visit. The waxing and waning of thyroid gland size and the heterogeneity of the gland are c/w evolving Hashimoto's disease.  5. Hypertension: Her BP is good today. The HCTZ prescribed by Dr. Orland Mustard has been beneficial. .   PLAN: 1. Diagnostic: Repeat  TFTs in 3 months and again in one year.  2. Therapeutic: Continue Synthroid to 88 mcg/day.  3. Patient education: We discussed all of the above at great length. Mrs. Ordonez always asks good questions. It is always a pleasure to see her.  4. Follow-up appointment in 12 months.  Level of Service: This visit lasted in excess of 55 minutes. More than 50% of the visit was devoted to counseling.  Tillman Sers, MD, CDE Adult and Pediatric Endocrinology

## 2019-12-21 DIAGNOSIS — E785 Hyperlipidemia, unspecified: Secondary | ICD-10-CM | POA: Diagnosis not present

## 2019-12-21 DIAGNOSIS — I10 Essential (primary) hypertension: Secondary | ICD-10-CM | POA: Diagnosis not present

## 2019-12-21 DIAGNOSIS — E559 Vitamin D deficiency, unspecified: Secondary | ICD-10-CM | POA: Diagnosis not present

## 2019-12-21 DIAGNOSIS — Z0000 Encounter for general adult medical examination without abnormal findings: Secondary | ICD-10-CM | POA: Diagnosis not present

## 2020-01-05 DIAGNOSIS — I10 Essential (primary) hypertension: Secondary | ICD-10-CM | POA: Diagnosis not present

## 2020-01-05 DIAGNOSIS — R5383 Other fatigue: Secondary | ICD-10-CM | POA: Diagnosis not present

## 2020-01-25 DIAGNOSIS — Z23 Encounter for immunization: Secondary | ICD-10-CM | POA: Diagnosis not present

## 2020-01-25 DIAGNOSIS — E559 Vitamin D deficiency, unspecified: Secondary | ICD-10-CM | POA: Diagnosis not present

## 2020-01-25 DIAGNOSIS — E785 Hyperlipidemia, unspecified: Secondary | ICD-10-CM | POA: Diagnosis not present

## 2020-01-25 DIAGNOSIS — R7401 Elevation of levels of liver transaminase levels: Secondary | ICD-10-CM | POA: Diagnosis not present

## 2020-01-25 DIAGNOSIS — Z0000 Encounter for general adult medical examination without abnormal findings: Secondary | ICD-10-CM | POA: Diagnosis not present

## 2020-01-25 DIAGNOSIS — R7989 Other specified abnormal findings of blood chemistry: Secondary | ICD-10-CM | POA: Diagnosis not present

## 2020-02-09 ENCOUNTER — Ambulatory Visit: Payer: BC Managed Care – PPO | Attending: Internal Medicine

## 2020-02-09 DIAGNOSIS — Z23 Encounter for immunization: Secondary | ICD-10-CM

## 2020-02-09 NOTE — Progress Notes (Signed)
   Covid-19 Vaccination Clinic  Name:  Beth Fuller    MRN: [id number] DOB: [birth year]  02/09/2020  [name] was observed post Covid-19 immunization for 15 minutes without incident. She was provided with Vaccine Information Sheet and instruction to access the V-Safe system.   [name] was instructed to call 911 with any severe reactions post vaccine: Marland Kitchen Difficulty breathing  . Swelling of face and throat  . A fast heartbeat  . A bad rash all over body  . Dizziness and weakness   Immunizations Administered    Name Date Dose VIS Date Route   Pfizer COVID-19 Vaccine 02/09/2020  4:08 PM 0.3 mL 11/04/2019 Intramuscular   Manufacturer: [place]   Lot: [id number]   [place]: [id number]

## 2020-02-10 DIAGNOSIS — R7401 Elevation of levels of liver transaminase levels: Secondary | ICD-10-CM | POA: Diagnosis not present

## 2020-02-13 DIAGNOSIS — H35033 Hypertensive retinopathy, bilateral: Secondary | ICD-10-CM | POA: Diagnosis not present

## 2020-02-21 ENCOUNTER — Encounter (INDEPENDENT_AMBULATORY_CARE_PROVIDER_SITE_OTHER): Payer: Self-pay

## 2020-02-21 ENCOUNTER — Other Ambulatory Visit: Payer: Self-pay | Admitting: Family Medicine

## 2020-02-21 ENCOUNTER — Other Ambulatory Visit (INDEPENDENT_AMBULATORY_CARE_PROVIDER_SITE_OTHER): Payer: Self-pay

## 2020-02-21 DIAGNOSIS — E890 Postprocedural hypothyroidism: Secondary | ICD-10-CM

## 2020-02-21 DIAGNOSIS — R7989 Other specified abnormal findings of blood chemistry: Secondary | ICD-10-CM

## 2020-02-21 MED ORDER — SYNTHROID 88 MCG PO TABS: 88.0000 ug | ORAL_TABLET | Freq: Every day | ORAL | 1 refills | Status: DC

## 2020-03-06 ENCOUNTER — Ambulatory Visit: Payer: BC Managed Care – PPO | Attending: Internal Medicine

## 2020-03-06 DIAGNOSIS — Z23 Encounter for immunization: Secondary | ICD-10-CM

## 2020-03-06 NOTE — Progress Notes (Signed)
   Covid-19 Vaccination Clinic  Name:  Beth Fuller    MRN: [id number] DOB: May 08, 1956  03/06/2020  Beth Fuller was observed post Covid-19 immunization for 15 minutes without incident. She was provided with Vaccine Information Sheet and instruction to access the V-Safe system.   Beth Fuller was instructed to call 911 with any severe reactions post vaccine: Marland Kitchen Difficulty breathing  . Swelling of face and throat  . A fast heartbeat  . A bad rash all over body  . Dizziness and weakness   Immunizations Administered    Name Date Dose VIS Date Route   Pfizer COVID-19 Vaccine 03/06/2020  4:53 PM 0.3 mL 11/04/2019 Intramuscular   Manufacturer: [place]   Lot: [id number]   [place]: [id number]

## 2020-03-12 DIAGNOSIS — R197 Diarrhea, unspecified: Secondary | ICD-10-CM | POA: Diagnosis not present

## 2020-03-12 DIAGNOSIS — R109 Unspecified abdominal pain: Secondary | ICD-10-CM | POA: Diagnosis not present

## 2020-03-13 ENCOUNTER — Other Ambulatory Visit: Payer: Self-pay | Admitting: Family Medicine

## 2020-03-13 DIAGNOSIS — R1084 Generalized abdominal pain: Secondary | ICD-10-CM

## 2020-03-13 DIAGNOSIS — R7989 Other specified abnormal findings of blood chemistry: Secondary | ICD-10-CM

## 2020-03-14 ENCOUNTER — Other Ambulatory Visit: Payer: Self-pay | Admitting: Family Medicine

## 2020-03-14 ENCOUNTER — Ambulatory Visit
Admission: RE | Admit: 2020-03-14 | Discharge: 2020-03-14 | Disposition: A | Discharge status: Home / Self Care | Payer: BC Managed Care – PPO | Source: Ambulatory Visit | Attending: Family Medicine | Admitting: Family Medicine

## 2020-03-14 ENCOUNTER — Other Ambulatory Visit: Payer: BC Managed Care – PPO

## 2020-03-14 DIAGNOSIS — R109 Unspecified abdominal pain: Secondary | ICD-10-CM

## 2020-03-14 DIAGNOSIS — R197 Diarrhea, unspecified: Secondary | ICD-10-CM | POA: Diagnosis not present

## 2020-03-14 MED ORDER — IOPAMIDOL (ISOVUE-300) INJECTION 61%
100.0000 mL | Freq: Once | INTRAVENOUS | Status: AC | PRN
  Administered 2020-03-14: 100 mL via INTRAVENOUS

## 2020-03-21 DIAGNOSIS — R1084 Generalized abdominal pain: Secondary | ICD-10-CM | POA: Diagnosis not present

## 2020-03-21 DIAGNOSIS — K580 Irritable bowel syndrome with diarrhea: Secondary | ICD-10-CM | POA: Diagnosis not present

## 2020-03-28 DIAGNOSIS — Z23 Encounter for immunization: Secondary | ICD-10-CM | POA: Diagnosis not present

## 2020-04-20 DIAGNOSIS — N3001 Acute cystitis with hematuria: Secondary | ICD-10-CM | POA: Diagnosis not present

## 2020-05-02 DIAGNOSIS — Z01419 Encounter for gynecological examination (general) (routine) without abnormal findings: Secondary | ICD-10-CM | POA: Diagnosis not present

## 2020-05-02 DIAGNOSIS — Z6824 Body mass index (BMI) 24.0-24.9, adult: Secondary | ICD-10-CM | POA: Diagnosis not present

## 2020-05-02 DIAGNOSIS — N9089 Other specified noninflammatory disorders of vulva and perineum: Secondary | ICD-10-CM | POA: Diagnosis not present

## 2020-05-02 DIAGNOSIS — Z124 Encounter for screening for malignant neoplasm of cervix: Secondary | ICD-10-CM | POA: Diagnosis not present

## 2020-05-02 DIAGNOSIS — Z1231 Encounter for screening mammogram for malignant neoplasm of breast: Secondary | ICD-10-CM | POA: Diagnosis not present

## 2020-06-21 DIAGNOSIS — G4700 Insomnia, unspecified: Secondary | ICD-10-CM | POA: Diagnosis not present

## 2020-06-21 DIAGNOSIS — I10 Essential (primary) hypertension: Secondary | ICD-10-CM | POA: Diagnosis not present

## 2020-06-21 DIAGNOSIS — R7989 Other specified abnormal findings of blood chemistry: Secondary | ICD-10-CM | POA: Diagnosis not present

## 2020-06-21 DIAGNOSIS — E039 Hypothyroidism, unspecified: Secondary | ICD-10-CM | POA: Diagnosis not present

## 2020-06-21 DIAGNOSIS — E559 Vitamin D deficiency, unspecified: Secondary | ICD-10-CM | POA: Diagnosis not present

## 2020-07-07 DIAGNOSIS — Z20822 Contact with and (suspected) exposure to covid-19: Secondary | ICD-10-CM | POA: Diagnosis not present

## 2020-08-12 DIAGNOSIS — Z1152 Encounter for screening for COVID-19: Secondary | ICD-10-CM | POA: Diagnosis not present

## 2020-08-17 ENCOUNTER — Other Ambulatory Visit (INDEPENDENT_AMBULATORY_CARE_PROVIDER_SITE_OTHER): Payer: Self-pay | Admitting: "Endocrinology

## 2020-08-17 DIAGNOSIS — E890 Postprocedural hypothyroidism: Secondary | ICD-10-CM

## 2020-09-22 IMAGING — CT CT ABDOMEN AND PELVIS WITH CONTRAST
2 of 5 series · 16 of 46 positions shown, 18 images · IV contrast (iopamidol)
Comparison: CT dated 11/14/2015.

CLINICAL DATA: Blood in stool.  Lower abdominal pain and diarrhea.

EXAM:
CT ABDOMEN AND PELVIS WITH CONTRAST
TECHNIQUE: Multidetector CT imaging of the abdomen and pelvis was performed
using the standard protocol following bolus administration of
intravenous contrast.
CONTRAST:  100mL G9CEO8-UPP IOPAMIDOL (G9CEO8-UPP) INJECTION 61%

[Series 2: abd pelvis (date) br40 s3 ax · axial · 0.68mm/px · z∈[+1237,+1652]mm · 13 of 93 slices shown, 15 images]
[im 5/93  soft-tissue]
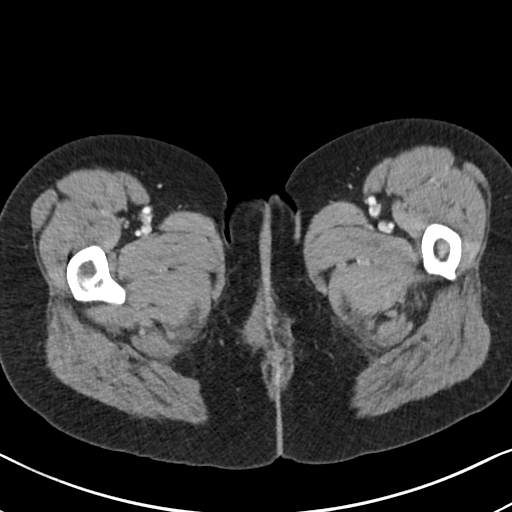
[im 5/93  bone]
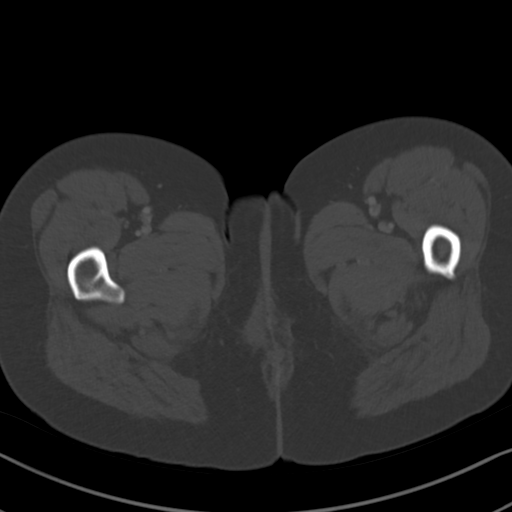
[im 14/93  soft-tissue]
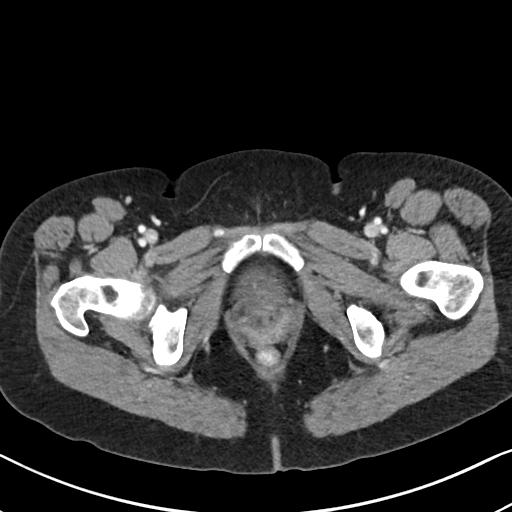
[im 19/93  soft-tissue]
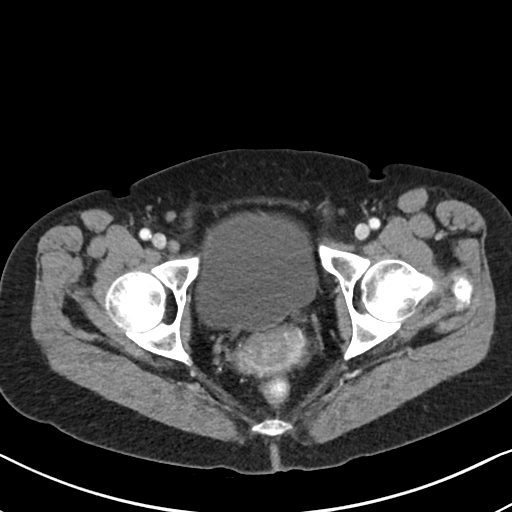
[im 28/93  soft-tissue]
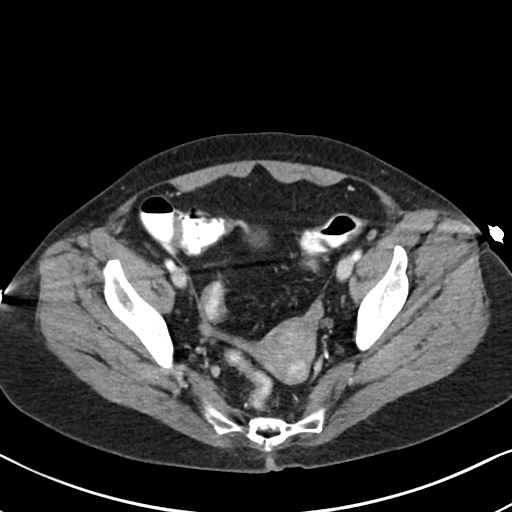
[im 33/93  soft-tissue]
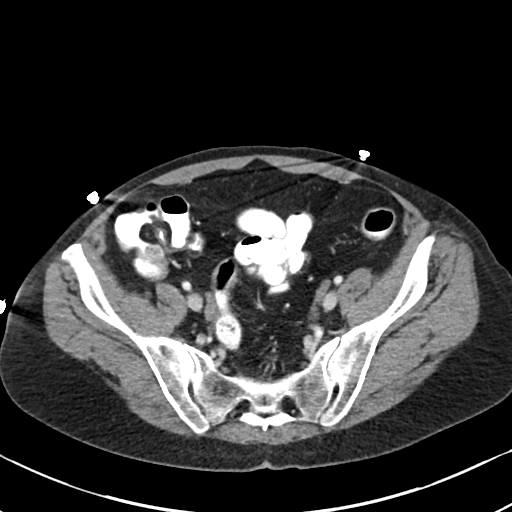
[im 42/93  soft-tissue]
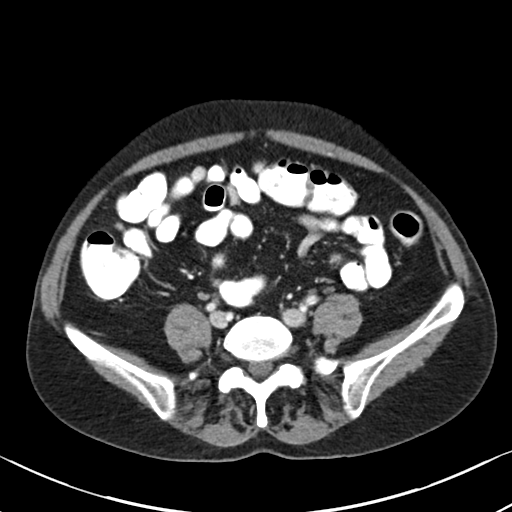
[im 47/93  soft-tissue]
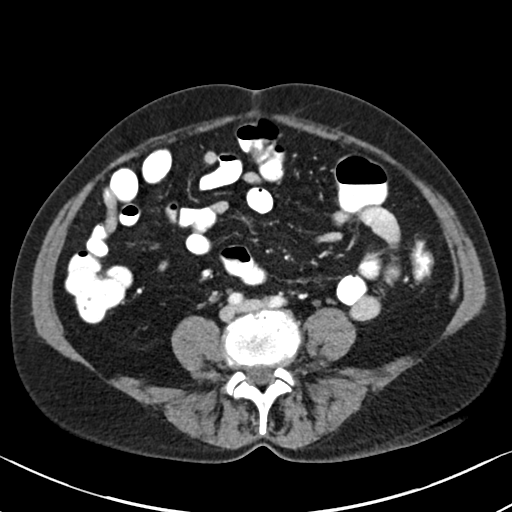
[im 51/93  soft-tissue]
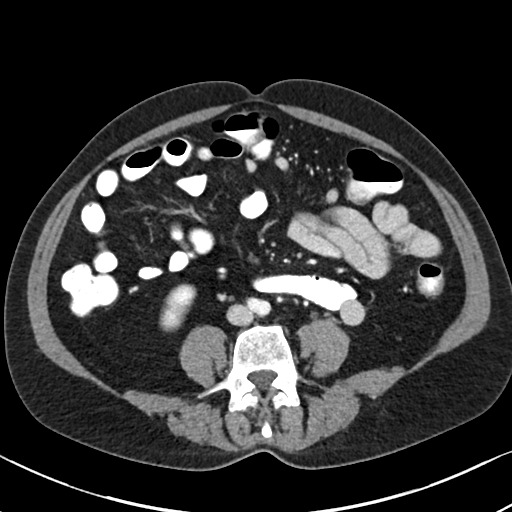
[im 60/93  soft-tissue]
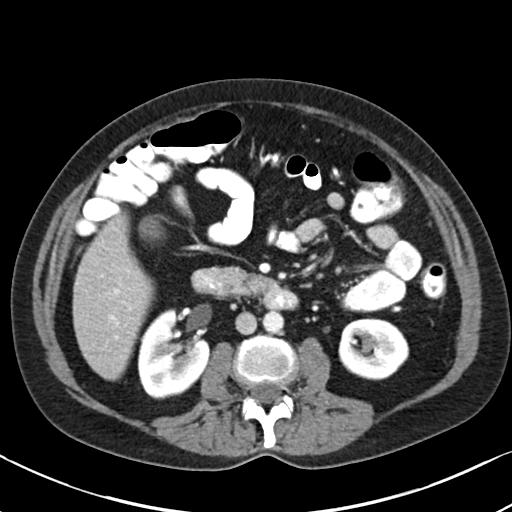
[im 60/93  bone]
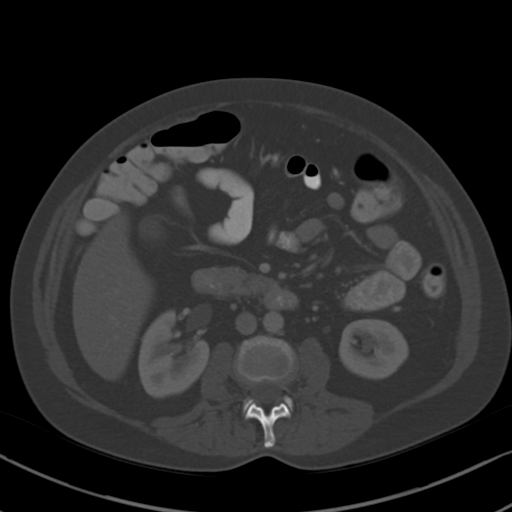
[im 65/93  soft-tissue]
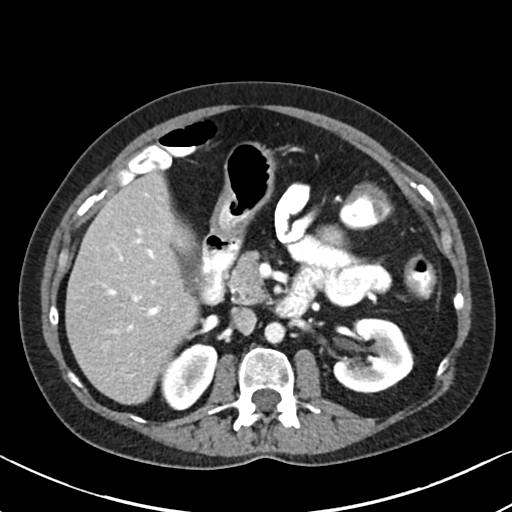
[im 74/93  soft-tissue]
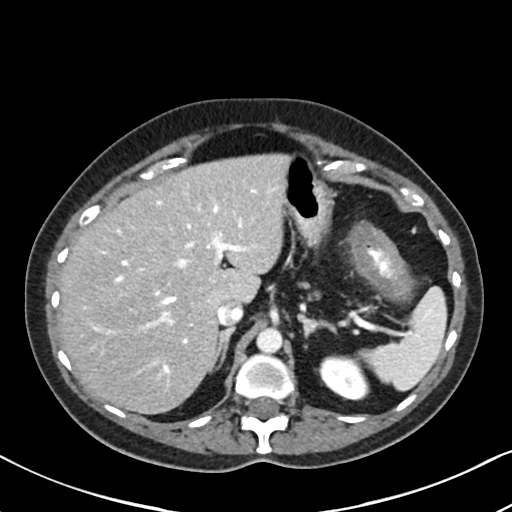
[im 79/93  soft-tissue]
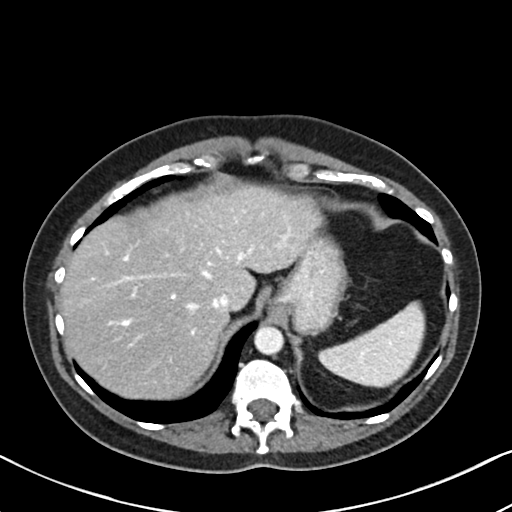
[im 88/93  soft-tissue]
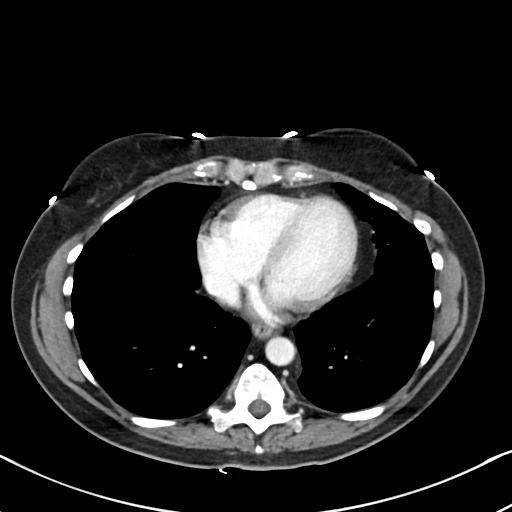

[Series 6: abd pelvis (date) br40 s3 cor · coronal · 0.68mm/px · 3 of 137 slices shown]
[im 46/137  soft-tissue]
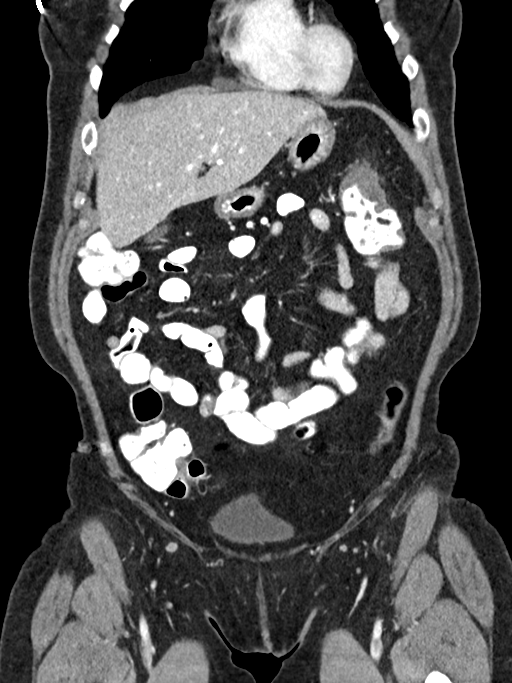
[im 61/137  soft-tissue]
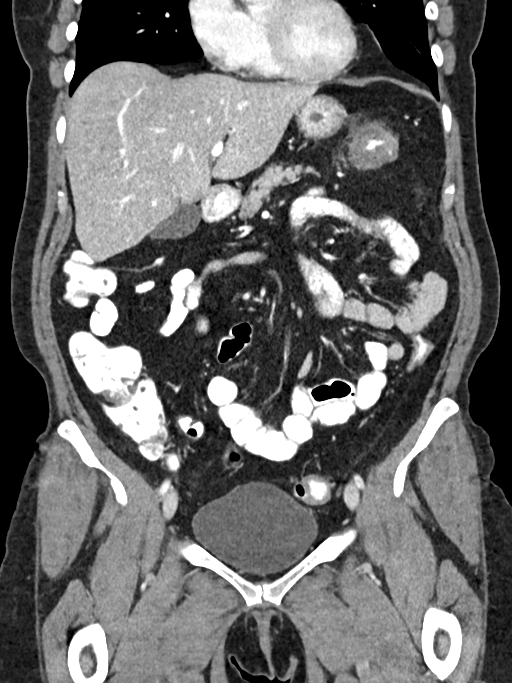
[im 76/137  soft-tissue]
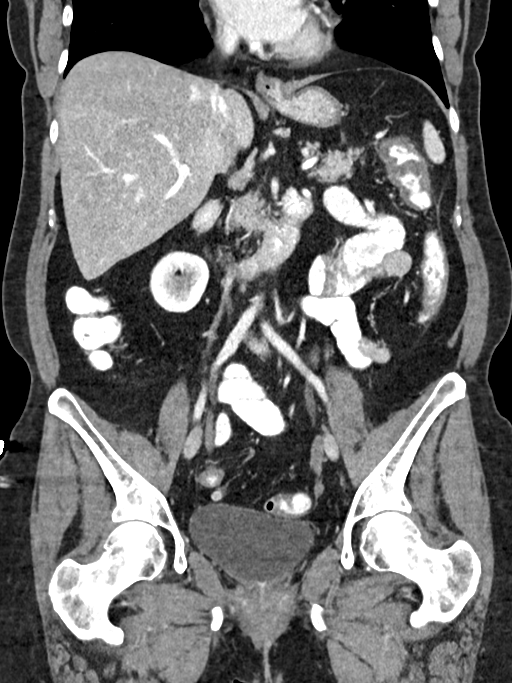

[16 of 46 positions shown; findings below may reference images not displayed]

FINDINGS: Lower chest: No acute abnormality.

Hepatobiliary: A few cysts are noted in the liver. There is no
worrisome hepatic mass. The gallbladder is unremarkable.

Pancreas: Unremarkable. No pancreatic ductal dilatation or
surrounding inflammatory changes.

Spleen: Normal in size without focal abnormality.

Adrenals/Urinary Tract: Adrenal glands are unremarkable. Kidneys are
normal, without renal calculi, focal lesion, or hydronephrosis.
Bladder is unremarkable.

Stomach/Bowel: There is diffuse wall thickening with adjacent fat
stranding primarily at the splenic flexure. There remaining portions
of the colon are essentially unremarkable. The appendix is
unremarkable. There is no evidence of a small-bowel obstruction.
There is no acute gastric abnormality.

Vascular/Lymphatic: No significant vascular findings are present. No
enlarged abdominal or pelvic lymph nodes.

Reproductive: There is a 3.2 cm mass arising from the posterior
uterine body. There is an additional 1.7 cm mass arising from the
posterior uterine fundus. No ovarian mass identified on today's
exam.

Other: No abdominal wall hernia or abnormality. No abdominopelvic
ascites.

Musculoskeletal: No acute or significant osseous findings.
IMPRESSION: 1. Infectious or inflammatory colitis involving the splenic flexure
of the colon. Ischemic colitis can be seen in this location as well,
however this seems less likely given the relative lack of
atherosclerotic changes of the patient's visualized arteries.
2. Fibroid uterus.
3. Normal appendix the right lower quadrant.

## 2020-11-29 ENCOUNTER — Ambulatory Visit (INDEPENDENT_AMBULATORY_CARE_PROVIDER_SITE_OTHER): Payer: BC Managed Care – PPO | Admitting: "Endocrinology

## 2020-12-11 ENCOUNTER — Ambulatory Visit: Payer: BC Managed Care – PPO | Admitting: Dermatology

## 2020-12-11 ENCOUNTER — Other Ambulatory Visit: Payer: Self-pay

## 2020-12-11 ENCOUNTER — Encounter: Payer: Self-pay | Admitting: Dermatology

## 2020-12-11 DIAGNOSIS — L720 Epidermal cyst: Secondary | ICD-10-CM | POA: Diagnosis not present

## 2020-12-11 DIAGNOSIS — L84 Corns and callosities: Secondary | ICD-10-CM | POA: Diagnosis not present

## 2020-12-11 DIAGNOSIS — D1801 Hemangioma of skin and subcutaneous tissue: Secondary | ICD-10-CM

## 2020-12-11 DIAGNOSIS — D485 Neoplasm of uncertain behavior of skin: Secondary | ICD-10-CM

## 2020-12-11 DIAGNOSIS — Z1283 Encounter for screening for malignant neoplasm of skin: Secondary | ICD-10-CM | POA: Diagnosis not present

## 2020-12-11 DIAGNOSIS — L820 Inflamed seborrheic keratosis: Secondary | ICD-10-CM | POA: Diagnosis not present

## 2020-12-11 DIAGNOSIS — L821 Other seborrheic keratosis: Secondary | ICD-10-CM

## 2020-12-11 NOTE — Progress Notes (Signed)
   Follow-Up Visit   Subjective  Beth Fuller is a 65 y.o. female who presents for the following: Annual Exam (Patient here today for yearly skin check.).  New growth Location: Chest Duration: 4 months Quality:  Associated Signs/Symptoms: Modifying Factors:  Severity:  Timing: Context: Also wants general skin check.  Objective  Well appearing patient in no apparent distress; mood and affect are within normal limits. Objective  Upper Mid Chest: Inflamed hornlike 6 mm crust       Objective  Left Forehead, Mid Back: Brown textured flattopped papules, typical dermoscopy  Objective  Mid Root of Nose: Dermal 1 mm white papules   All sun exposed areas plus back examined.  Plus chest and abdomen.   Assessment & Plan   New spots several months on the chest which has become inflamed recently.  Some enlargement in older spots on the back and the left frontal hairline. Examination of the chest, back, head, neck performed.  Multiple milia particularly on the upper nose glabella area.  Should she wish these removed, she will schedule as a separate visit.  Cutaneous horn on the V of the chest; photo taken which will have to be transferred to epic.  Small cluster of seborrheic keratoses left temple hairline and solitary lesion mid back; dermoscopy typical.  Multiple cherry angiomas.  No atypical mole or melanoma. Skin exam for malignant neoplasm Head - Anterior (Face)  Neoplasm of uncertain behavior of skin Upper Mid Chest  Skin / nail biopsy Type of biopsy: tangential   Informed consent: discussed and consent obtained   Timeout: patient name, date of birth, surgical site, and procedure verified   Procedure prep:  Patient was prepped and draped in usual sterile fashion (Non sterile) Prep type:  Chlorhexidine Anesthesia: the lesion was anesthetized in a standard fashion   Anesthetic:  1% lidocaine w/ epinephrine 1-100,000 local infiltration Instrument used: flexible razor  blade   Hemostasis achieved with: ferric subsulfate and electrodesiccation   Outcome: patient tolerated procedure well   Post-procedure details: sterile dressing applied and wound care instructions given   Dressing type: bandage and petrolatum    Specimen 1 - Surgical pathology Differential Diagnosis: R/O BCC vs SCC  Check Margins: No  Cautery after biopsy  Seborrheic keratosis (2) Mid Back; Left Forehead  Leave if stable  Milia Mid Root of Nose  Patient may choose to schedule removal.     I, Lavonna Monarch, MD, have reviewed all documentation for this visit.  The documentation on 12/11/20 for the exam, diagnosis, procedures, and orders are all accurate and complete.

## 2021-01-02 DIAGNOSIS — L309 Dermatitis, unspecified: Secondary | ICD-10-CM | POA: Diagnosis not present

## 2021-01-02 DIAGNOSIS — I10 Essential (primary) hypertension: Secondary | ICD-10-CM | POA: Diagnosis not present

## 2021-01-11 DIAGNOSIS — Z0000 Encounter for general adult medical examination without abnormal findings: Secondary | ICD-10-CM | POA: Diagnosis not present

## 2021-01-11 DIAGNOSIS — E559 Vitamin D deficiency, unspecified: Secondary | ICD-10-CM | POA: Diagnosis not present

## 2021-01-11 DIAGNOSIS — E785 Hyperlipidemia, unspecified: Secondary | ICD-10-CM | POA: Diagnosis not present

## 2021-01-15 DIAGNOSIS — E890 Postprocedural hypothyroidism: Secondary | ICD-10-CM | POA: Diagnosis not present

## 2021-01-15 LAB — T3, FREE: T3, Free: 2.8 pg/mL (ref 2.3–4.2)

## 2021-01-15 LAB — T4, FREE: Free T4: 1.6 ng/dL (ref 0.8–1.8)

## 2021-01-15 LAB — TSH: TSH: 1.51 mIU/L (ref 0.40–4.50)

## 2021-01-16 NOTE — Progress Notes (Signed)
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 Caucasian lady. She was unaccompanied.  1. Beth Fuller had her initial endocrine consultation visit with me on 08/26/2005:  A. Beth Fuller had a partial thyroidectomy for thyrotoxicosis associated with Graves' Disease on about 1986. She had significant exophthalmos at that time. She remained euthyroid for about 10 years, then in about 1996 she became hypothyroid and had to begin Synthroid therapy. Since then she had had TFTs within the normal range on a Synthroid dose of 75 mcg/day.   B. In early 2006 Beth Fuller developed pains in the posterior cervical spine radiating down her arms. An MRI on 05/18/05 showed multiple bony lesions, but also a 1.4 cm lesion of the right thyroid lobe, probably consistent with an adenoma. Athyroid [place] study performed on 06/02/05 showed that her right lobe measured 3.5 x 1.6 x 1.4 cm. There was a 1.4 x 1.4 x 1.7 cm complex nodule in the right lower pole. The left lobe measured 0.9 x 0.9 x 1.8 cm. A fine needle aspiration of the nodule was performed on 07/21/05. This study showed abundant colloid and numerous follicles in sheets and balls, more consistent with adenomatous goiter than neoplasia.  C. When I saw Beth Fuller at that initial visit, she had just a slight prominence of her right eye as a residual effect of  her Graves ophthalmopathy. The lower portion of her right thyroid lobe was relatively more firm than the remainder of the gland. I subsequently reviewed her [place] in radiology and concurred with its findings. I also reviewed her path slides in pathology. I agreed with the pathologist that there was no evidence of atypia. TFTs showed that she was euthyroid on her Synthroid dose of 75 mcg per day.   2. Clinical course:  A. In May  2007 I ordered a repeat US. This time there was a suggestion that the dominant right lobe nodule and one other nodule might have grown slightly. I therefore ordered a repeat FNA. This study showed scattered groups of follicular epithelium with no atypia, more c/w adenomatous nodule than neoplasia. I again reviewed the path slides with the pathologist and concurred with her conclusions.  B. We continued to do serial [place] studies every 6 months through July 2009, which essentially remained unchanged. She had her third FNA on 07/28/08. I again reviewed those slides with the pathologist, who felt that the findings were most c/w a non-neoplastic goiter. I concurred.   C. Since then we have performed serial [place] studies annually and later biennially, the most recent being 05/31/18. The dominant nodule has remained essentially unchanged. The smaller nodules seemed to have become a bit smaller over time. She has remained euthyroid on 75 mcg of Synthroid per day.   D. She had an episode of colitis and hematotchezia in about February 2020.  E. She had covid-19 in November 2020. She recovered completely.   4. Beth. Sobol last [facility] visit was on 11/08/19. At that visit I continued her Synthroid dose of 88 mcg/day.  A. In the interim she has been healthy,.  Her BP has been higher, so she is taking more medications. She is also gaining weight. It is harder to kept the weight off, in part due to not exercising much. Her energy level is okay, less than it was in the past. She walks her  dog about 3 times per week. .    B. She now takes HCTZ each morning.  She remains on Synthroid, 88 mcg/day. She also remains on Lopressor, imipramine, gabapentin, and Klonopin. Her new medication is amlodipine.   C. She is not having any cervical arthritis and radiculopathy.   D.  She still sometimes has an upset stomach after a big meal. She will see a GI specialists in April.   E. She is not trying as hard to eat fairly healthy as she used to  do.    F. She is doing "well" emotionally and mentally. She is still working from home, so her job stress has significantly decreased.     5. Pertinent Review of Systems: Constitutional: The patient feels "pretty good". "I don't have any aches and pains." She sleeps well.   Eyes: Vision is good. There are no significant eye complaints. Her exophthalmos has resolved.  Neck: The patient has occasionally had the sensation of some prominence in her anterior neck, but no complaints of anterior neck soreness, tenderness, pressure, discomfort, or difficulty swallowing.  Heart: Heart rate increases with exercise or other physical activity. The patient has no complaints of palpitations, irregular heat beats, chest pain, or chest pressure. Gastrointestinal: As above. She occasionally has an upset stomach, usually only when she is stressed. Bowel movents seem normal most of the time. The patient has no complaints of excessive hunger, acid reflux, diarrhea, or constipation. Legs: Muscle mass and strength seem normal. There are no complaints of numbness, tingling, burning, or pain. No edema is noted. Feet: There are no obvious foot problems. There are no complaints of numbness, tingling, burning, or pain. No edema is noted GYN: Her IUD was removed in late 2011. Menses never returned.  She occasionally has hot flushes, not anything major.   Skin: She saw  Dr. Salome Holmes in dermatology for an evaluation of the dark spot at the left temporal area. He told her it was a benign keratosis. He saw her again in about July 2018 and in February 2020. She will have an annual follow up visit soon.    PAST MEDICAL, FAMILY, AND SOCIAL HISTORY:  1. Work and family: She has been divorced for two years. Her dad died from covid last Fall. Her mother still lives in [place]. 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 She does not have any grandchildren yet, but does  have "granddog" that she frequently cares for.  2. Activities: She has not been exercising much.   3. Primary care: Dr. London Pepper, [facility] [place]: There are no other significant items that are bothering Beth. [place] at this time.   PHYSICAL EXAM:  BP 120/70    Pulse 80    Wt 146 lb (66.2 kg)   Growth percentile SmartLinks can only be used for patients less than 49 years old.  Wt Readings from Last 3 Encounters:  01/17/21 146 lb (66.2 kg)  11/08/19 148 lb 9.6 oz (67.4 kg)  04/26/18 138 lb (62.6 kg)    Ht Readings from Last 3 Encounters:  No data found for Ht    HC Readings from Last 3 Encounters:  No data found for [facility]   Constitutional: The patient looks good. She looks healthy and appears physically and emotionally well. She has lost 2.5 pounds since her last visit, but gained more fat. Her affect and insight  are very normal.  Head: Normocephalic Eyes: There is no arcus or proptosis. Her right eye is no longer  more prominent than her left. Both are within the range of normal. Mouth: The oropharynx appears normal. The tongue appears normal. There is normal oral moisture. Neck: There are no bruits present. The thyroid gland is top-normal in size today, to include the isthmus. The consistency of the thyroid gland is normal. There is no thyroid tenderness to palpation.  Lungs: The lungs are clear. Air movement is good. Heart: The heart rhythm and rate appear normal. Heart sounds S1 and S2 are normal. I do not appreciate any pathologic heart murmurs. Abdomen: The abdomen is more enlarged.  Bowel sounds are normal. The abdomen is soft and non-tender. There is no obviously palpable hepatomegaly, splenomegaly, or other masses.  Arms: Muscle mass appears appropriate for age.  Hands: There is no obvious tremor. Phalangeal and metacarpophalangeal joints appear normal. Palms are normal. Legs: Muscle mass appears appropriate for age. There is no edema.  Neurologic:  Muscle strength is normal for age and gender  in both the upper and the lower extremities. Muscle tone appears normal. Sensation to touch is normal in the legs.  LAB DATA:  Labs 01/15/21: TSH 1.51, free T4 1.6, free T3 2.8  Labs 10/25/19: TSH 2.09, free T4 1.5, free T3 2.9  Labs 10/04/18: TSH 1.01, free T4 1.2, free T3 2.5  Labs 04/22/18: TSH 2.14, free T4 1.3, free T3 2.7  Labs 05/13/17: TSH 1.56, free T4 1.6, free T3 2.6  Labs 4/04/7: TSH 0.83, free T4 1.4, free T3 2.6  Labs 02/23/15: TSH 1.036, free T4 1.14, free T3 2.4  Labs 02/17/14: TSH 1.862, free T4 1.61, free T3 2.8  Labs 02/21/13: TSH 1.036, free T4 1.14, free T3 2.4  Labs 09/21/12: TSH 1.970, free T4 1.44, free T3 2.6   Labs 06/14/12: TSH 1.675, free T4 1.37, free T3 2.4).  IMAGING:  Thyroid US 05/31/18: The parenchymal echotexture is markedly heterogenous. She has a 2.3 x 2.1 x 2.1 cm nodule in the right lobe that is essentially unchanged since 2014 or before. The impression was confirmed 5-year stability. No further follow up was required.     Thyroid US 06/15/17: Echotexture was moderately heterogeneous. Right lobe measured 3.4 x. 2.1 x 1.9 cm. Left lobe measured 3.8 x 1.1 x 0.9 cm. The lobes were a very small amount larger. She had a solitary 2.3 x 1.9 x 1.6 cm complex, mostly solid nodule with macrocalcifications.The size and characteristics of the nodule were essentially unchanged.   Thyroid US 02/28/15: Right lobe measured 3.3 x 2.2 x 1.6 cm (stable size). Stable dominant central nodule which is partially solid and cystic has stable morphology and measures approximately 2.2 x 2.0 x 1.5 cm. Left lobe measures 3.7 x 1.0 x 1.1 cm (stable size). Stable vague 10 mm area of nodularity in the superior left lobe. Stable hypoechoic nodule near the juncture of the left lobe and isthmus measures 0.4 cm. Stable cyst in the inferior left lobe measures 0.3 cm. No nodules visualized.   Thyroid US 01/03/13: The dominant nodule in the right  lobe was 1.5 x 2.0 x 2.3 cm, compared with 1.8 x 2.0 x 2.7 previously. Her left lobe nodule was 7 x 8 x 9 mm, compared with 8 x 8 x 12 previously. A 3 x 4 x 4 solid nodule was also sen in the mid-left thyroid lobe.  Thyroid US 06/15/12: The dominant nodule in the  right lobe is  2.7 x 2.0 x 1.8 cm vs 2.3 x 1.7 x 1.6, slightly larger than on 04/01/11. Left lobe nodule is 1.2 x 0.8 x 0.8 cm, stable in size. I reviewed her images with Dr. Laurence Ferrari in [place]. The [place] in 2012 cut across the nodule parallel to the long axis of the nodule. The [place] this year cut across the nodule on a slight diagonal. It appears that the difference in technique probably accounts for the difference in measurements. Dr. Laurence Ferrari recommended repeating the [place] in 6 months.   ASSESSMENT: 1. Hypothyroid, due in part to partial thyroidectomy and due in part to autoimmune destruction of thyrocytes associated with Hashimoto's disease:   A. In the past year her TFTs have remained euthyroid on her current Synthroid dose of 88 mcg/day.   B. She is currently at about the 50% of the true normal thyroid hormone range on her current dose of Synthroid taken each evening.   C. Her set of TFTs in December 2020 was abnormal, in that all three of the TFTs had increased in parallel together. The shift of all three TFTs in parallel together, upward or downward, is pathognomonic for an interim flare up of thyroiditis.    D. Her TFTS in February 2022 were mid-euthyroid.  2. Non-toxic nodular goiter/abnormal thyroid US:   A. Her thyroid gland is within normal size again today. I can't feel any nodules today.   B. After her last visit in 2019 we had a thyroid US performed. That study showed the dominant central right thyroid lobe nodule which appeared to be about the same size and did not show any abnormal morphological characteristics. No significant nodularity was seen on the left. After at least 5 years of stability, the radiologic  assessment was that no further follow up was required.   C. Given the benign course of her NTNG, and the fact that her thyroid gland has actually shrunk back to normal size in the past three years, I agree that we can safely defer further [place] studies unless she has more symptoms in her anterior neck. Her symptoms involving the isthmus in late 2020 occurred simultaneously with the recent flare up of thyroiditis and did not involve her right thyroid lobe, so I am not concerned about these recent symptoms. However, I asked her to contact me if she has any symptoms in the right lobe area. She did not need a repeat FNA at that time.   D. Her thyroid gland is top-normal size today, but is non-tender.  3. Exophthalmos: This problem has resolved. I do not see any difference in her eyes today.  4. Hashimoto's disease: This problem is clinically quiescent.. The waxing and waning of thyroid gland size and the heterogeneity of the gland are c/w evolving Hashimoto's disease.  5. Hypertension: Her BP is good today. The HCTZ prescribed by Dr. Orland Mustard has been beneficial. .   PLAN: 1. Diagnostic: Repeat  TFTs in one year.  2. Therapeutic: Continue Synthroid dose of 88 mcg/day.  3. Patient education: We discussed all of the above at great length. Mrs. Knack always asks good questions. It is always a pleasure to see her.  4. Follow-up appointment in 12 months.  Level of Service: This visit lasted in excess of 55 minutes. More than 50% of the visit was devoted to counseling.  Tillman Sers, MD, CDE Adult and Pediatric Endocrinology

## 2021-01-17 ENCOUNTER — Ambulatory Visit (INDEPENDENT_AMBULATORY_CARE_PROVIDER_SITE_OTHER): Payer: BC Managed Care – PPO | Admitting: "Endocrinology

## 2021-01-17 ENCOUNTER — Other Ambulatory Visit: Payer: Self-pay

## 2021-01-17 ENCOUNTER — Encounter (INDEPENDENT_AMBULATORY_CARE_PROVIDER_SITE_OTHER): Payer: Self-pay | Admitting: "Endocrinology

## 2021-01-17 VITALS — BP 120/70 | HR 80 | Wt 146.0 lb

## 2021-01-17 DIAGNOSIS — E042 Nontoxic multinodular goiter: Secondary | ICD-10-CM | POA: Diagnosis not present

## 2021-01-17 DIAGNOSIS — E349 Endocrine disorder, unspecified: Secondary | ICD-10-CM

## 2021-01-17 DIAGNOSIS — E063 Autoimmune thyroiditis: Secondary | ICD-10-CM | POA: Diagnosis not present

## 2021-01-17 NOTE — Patient Instructions (Signed)
Follow up visit in one year. Please repeat lab tests 1-2 weeks prior.

## 2021-02-05 ENCOUNTER — Other Ambulatory Visit (INDEPENDENT_AMBULATORY_CARE_PROVIDER_SITE_OTHER): Payer: Self-pay | Admitting: "Endocrinology

## 2021-02-05 DIAGNOSIS — E890 Postprocedural hypothyroidism: Secondary | ICD-10-CM

## 2021-05-01 DIAGNOSIS — K580 Irritable bowel syndrome with diarrhea: Secondary | ICD-10-CM | POA: Diagnosis not present

## 2021-08-03 ENCOUNTER — Other Ambulatory Visit (INDEPENDENT_AMBULATORY_CARE_PROVIDER_SITE_OTHER): Payer: Self-pay | Admitting: "Endocrinology

## 2021-08-03 DIAGNOSIS — E890 Postprocedural hypothyroidism: Secondary | ICD-10-CM

## 2021-08-09 DIAGNOSIS — I10 Essential (primary) hypertension: Secondary | ICD-10-CM | POA: Diagnosis not present

## 2021-08-09 DIAGNOSIS — G4700 Insomnia, unspecified: Secondary | ICD-10-CM | POA: Diagnosis not present

## 2021-08-09 DIAGNOSIS — Z23 Encounter for immunization: Secondary | ICD-10-CM | POA: Diagnosis not present

## 2021-12-04 DIAGNOSIS — Z6824 Body mass index (BMI) 24.0-24.9, adult: Secondary | ICD-10-CM | POA: Diagnosis not present

## 2021-12-04 DIAGNOSIS — Z01419 Encounter for gynecological examination (general) (routine) without abnormal findings: Secondary | ICD-10-CM | POA: Diagnosis not present

## 2021-12-04 DIAGNOSIS — Z1231 Encounter for screening mammogram for malignant neoplasm of breast: Secondary | ICD-10-CM | POA: Diagnosis not present

## 2021-12-06 ENCOUNTER — Other Ambulatory Visit: Payer: Self-pay | Admitting: Obstetrics and Gynecology

## 2021-12-06 DIAGNOSIS — R928 Other abnormal and inconclusive findings on diagnostic imaging of breast: Secondary | ICD-10-CM

## 2021-12-23 ENCOUNTER — Ambulatory Visit
Admission: RE | Admit: 2021-12-23 | Discharge: 2021-12-23 | Disposition: A | Discharge status: Home / Self Care | Payer: BC Managed Care – PPO | Source: Ambulatory Visit | Attending: Obstetrics and Gynecology | Admitting: Obstetrics and Gynecology

## 2021-12-23 ENCOUNTER — Ambulatory Visit: Payer: BC Managed Care – PPO

## 2021-12-23 DIAGNOSIS — R928 Other abnormal and inconclusive findings on diagnostic imaging of breast: Secondary | ICD-10-CM

## 2021-12-23 DIAGNOSIS — R922 Inconclusive mammogram: Secondary | ICD-10-CM | POA: Diagnosis not present

## 2022-01-13 DIAGNOSIS — E063 Autoimmune thyroiditis: Secondary | ICD-10-CM | POA: Diagnosis not present

## 2022-01-13 LAB — T4, FREE: Free T4: 1.5 ng/dL (ref 0.8–1.8)

## 2022-01-13 LAB — T3, FREE: T3, Free: 2.9 pg/mL (ref 2.3–4.2)

## 2022-01-13 LAB — TSH: TSH: 0.71 mIU/L (ref 0.40–4.50)

## 2022-01-24 ENCOUNTER — Ambulatory Visit (INDEPENDENT_AMBULATORY_CARE_PROVIDER_SITE_OTHER): Payer: Self-pay | Admitting: "Endocrinology

## 2022-01-25 ENCOUNTER — Other Ambulatory Visit (INDEPENDENT_AMBULATORY_CARE_PROVIDER_SITE_OTHER): Payer: Self-pay | Admitting: "Endocrinology

## 2022-01-25 DIAGNOSIS — E890 Postprocedural hypothyroidism: Secondary | ICD-10-CM

## 2022-02-10 NOTE — Progress Notes (Signed)
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 ?HPI: Beth Fuller is a 66 y.o. Caucasian lady. She was unaccompanied. ? ?1. Beth Fuller had her initial endocrine consultation visit with me on 08/26/2005: ? A. Beth Fuller had a partial thyroidectomy for thyrotoxicosis associated with Graves' Disease on about 1986. She had significant exophthalmos at that Fuller. She remained euthyroid for about 10 years, then in about 1996 she became hypothyroid and had to begin Synthroid therapy. Since then she had had TFTs within the normal range on a Synthroid dose of 75 mcg/day.  ? B. In early 2006 Beth Fuller developed pains in the posterior cervical spine radiating down her arms. An MRI on 05/18/05 showed multiple bony lesions, but also a 1.4 cm lesion of the right thyroid lobe, probably consistent with an adenoma. Athyroid [place] study performed on 06/02/05 showed that her right lobe measured 3.5 x 1.6 x 1.4 cm. There was a 1.4 x 1.4 x 1.7 cm complex nodule in the right lower pole. The left lobe measured 0.9 x 0.9 x 1.8 cm. A fine needle aspiration of the nodule was performed on 07/21/05. This study showed abundant colloid and numerous follicles in sheets and balls, more consistent with adenomatous goiter than neoplasia. ? C. When I saw Beth Fuller at that initial visit, she had just a slight prominence of her right eye as a residual effect of  her Graves ophthalmopathy. The lower portion of her right thyroid lobe was relatively more firm than the remainder of the gland. I subsequently reviewed her [place] in radiology and concurred with its findings. I also reviewed her path slides in pathology. I agreed with the pathologist that there was no evidence of atypia. TFTs showed that she was euthyroid on her Synthroid dose of 75 mcg per day.  ? ?2. Clinical course: ? A. In May  2007 I ordered a repeat US. This Fuller there was a suggestion that the dominant right lobe nodule and one other nodule might have grown slightly. I therefore ordered a repeat FNA. This study showed scattered groups of follicular epithelium with no atypia, more c/w adenomatous nodule than neoplasia. I again reviewed the path slides with the pathologist and concurred with her conclusions. ? B. We continued to do serial [place] studies every 6 months through July 2009, which essentially remained unchanged. She had her third FNA on 07/28/08. I again reviewed those slides with the pathologist, who felt that the findings were most c/w a non-neoplastic goiter. I concurred.  ? C. Since then we have performed serial [place] studies annually and later biennially, the most recent being 05/31/18. The dominant nodule has remained essentially unchanged. The smaller nodules seemed to have become a bit smaller over Fuller. She has remained euthyroid on 75 mcg of Synthroid per day.  ? D. She had an episode of colitis and hematotchezia in about February 2020. ? E. She had covid-19 in November 2020. She recovered completely. ? F. Her dia died from covid-19 in the Fall of 2021. ?  ?4. Beth Fuller last [facility] visit was on 01/17/21. At that visit I continued her Synthroid dose of 88 mcg/day. ? A. In the interim she has been healthy.  Her BP has been under control with her medications. She is also gaining a little weight. She goes to a [employer] and is also exercising frequently. Her energy level is pretty good, but  she sometimes gets sleepy during the day. She is snoring more and waking up choking at Fuller. Her PCP is setting her up for a sleep study. She lost her dog last year.    ? B. She remains on Synthroid, 88 mcg/day. She also remains on HCTZ, Lopressor, amlodipine, imipramine, gabapentin, and Klonopin.  ? C. She is not having any cervical arthritis and radiculopathy.  ? D.  She still sometimes has an upset stomach after a big meal. She saw  a GI specialists in April 2022. She is now taking a probiotic. She no longer takes Nexium. She takes Pepcid OC to treat reflux after dinner.  ? E.. She is doing "well" emotionally and mentally. She is still working from home, so her job stress has significantly decreased. She is happy with her life. ?   ?5. Pertinent Review of Systems: ?Constitutional: The patient feels "good, better physically and mentally than many of her friends".  ?Eyes: Vision is good. There are no significant eye complaints. Her exophthalmos has resolved.  ?Neck: The patient has no complaints of anterior neck soreness, tenderness, pressure, discomfort, or difficulty swallowing.  ?Heart: Heart rate increases with exercise or other physical activity. The patient has no complaints of palpitations, irregular heat beats, chest pain, or chest pressure. ?Gastrointestinal: As above. She occasionally has reflux after dinner. Bowel movents seem normal when she takes her probiotic. The patient has no complaints of excessive hunger, acid reflux, diarrhea, or constipation. ?Hands; No problems ?Legs: Muscle mass and strength seem normal. There are no complaints of numbness, tingling, burning, or pain. No edema is noted. ?Feet: There are no obvious foot problems. There are no complaints of numbness, tingling, burning, or pain. No edema is noted. ?GYN: Her IUD was removed in late 2011. Menses never returned.  She occasionally has hot flushes, not often, not problematic.    ?Skin: She saw  Dr. Salome Holmes in dermatology for an evaluation of the dark spot at the left temporal area. He told her it was a benign keratosis. She has had annual follow up.  ?  ?PAST MEDICAL, FAMILY, AND SOCIAL HISTORY: ? ?1. Work and family: She has been divorced for two years. Her mother still lives in [place] and [place] visits her several days each month. 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 She does not have  any grandchildren yet. They lost her "granddog" last year.   ?2. Activities: She has been exercising more.   ?3. Primary care: Dr. London Pepper, [facility] [place] ? ?REVIEW OF SYSTEMS: There are no other significant items that are bothering Beth Fuller.  ? ?PHYSICAL EXAM: ? ?BP 118/80   Pulse 72   Wt 149 lb (67.6 kg)  Repeat BP was 118/74. ? ?Growth percentile SmartLinks can only be used for patients less than 89 years old. ? ?Wt Readings from Last 3 Encounters:  ?02/11/22 149 lb (67.6 kg)  ?01/17/21 146 lb (66.2 kg)  ?11/08/19 148 lb 9.6 oz (67.4 kg)  ? ? ?Ht Readings from Last 3 Encounters:  ?No data found for Ht  ? ? ?HC Readings from Last 3 Encounters:  ?No data found for [facility]  ? ?Constitutional: The patient looks good. She looks healthy and appears physically and emotionally well. She has gained 3 pounds since her last visit. Her affect and insight are very normal.  ?Head: Normocephalic ?Eyes: There is no arcus  or proptosis. Her right eye is no longer  more prominent than her left. Both are within the range of normal. Her EOMS are normal. ?Mouth: The oropharynx appears normal. The tongue appears normal. There is normal oral moisture. ?Neck: There are no bruits present. The thyroid gland is very minimally and symmetrically enlarged today. The consistency of the thyroid gland is normal. There is no thyroid tenderness to palpation.  ?Lungs: The lungs are clear. Air movement is good. ?Heart: The heart rhythm and rate appear normal. Heart sounds S1 and S2 are normal. I do not appreciate any pathologic heart murmurs. ?Abdomen: The abdomen is more enlarged.  Bowel sounds are normal. The abdomen is soft and non-tender. There is no obviously palpable hepatomegaly, splenomegaly, or other masses.  ?Arms: Muscle mass appears appropriate for age.  ?Hands: There is no obvious tremor. Phalangeal and metacarpophalangeal joints appear normal. Palms are normal. ?Legs: Muscle mass appears appropriate for age. There  is no edema.  ?Neurologic: Muscle strength is normal for age and gender  in both the upper and the lower extremities. Muscle tone appears normal. Sensation to touch is normal in the legs. ? ?LAB DATA: ?

## 2022-02-11 ENCOUNTER — Encounter (INDEPENDENT_AMBULATORY_CARE_PROVIDER_SITE_OTHER): Payer: Self-pay | Admitting: "Endocrinology

## 2022-02-11 ENCOUNTER — Ambulatory Visit (INDEPENDENT_AMBULATORY_CARE_PROVIDER_SITE_OTHER): Payer: BC Managed Care – PPO | Admitting: "Endocrinology

## 2022-02-11 ENCOUNTER — Other Ambulatory Visit: Payer: Self-pay

## 2022-02-11 VITALS — BP 118/74 | HR 72 | Wt 149.0 lb

## 2022-02-11 DIAGNOSIS — E890 Postprocedural hypothyroidism: Secondary | ICD-10-CM

## 2022-02-11 DIAGNOSIS — E063 Autoimmune thyroiditis: Secondary | ICD-10-CM | POA: Diagnosis not present

## 2022-02-11 DIAGNOSIS — E0500 Thyrotoxicosis with diffuse goiter without thyrotoxic crisis or storm: Secondary | ICD-10-CM | POA: Diagnosis not present

## 2022-02-11 DIAGNOSIS — E042 Nontoxic multinodular goiter: Secondary | ICD-10-CM

## 2022-02-11 DIAGNOSIS — I10 Essential (primary) hypertension: Secondary | ICD-10-CM | POA: Diagnosis not present

## 2022-02-11 NOTE — Patient Instructions (Signed)
Follow up visit in one year. Please repeat lab tests 1-2 weeks prior.  ? ?At [facility], we are committed to providing exceptional care. You will receive a patient satisfaction survey through text or email regarding your visit today. Your opinion is important to me. Comments are appreciated. ? ?

## 2022-02-13 ENCOUNTER — Ambulatory Visit (INDEPENDENT_AMBULATORY_CARE_PROVIDER_SITE_OTHER): Payer: Self-pay | Admitting: "Endocrinology

## 2022-06-26 ENCOUNTER — Encounter (INDEPENDENT_AMBULATORY_CARE_PROVIDER_SITE_OTHER): Payer: Self-pay

## 2022-07-14 ENCOUNTER — Other Ambulatory Visit (INDEPENDENT_AMBULATORY_CARE_PROVIDER_SITE_OTHER): Payer: Self-pay | Admitting: "Endocrinology

## 2022-07-14 DIAGNOSIS — E890 Postprocedural hypothyroidism: Secondary | ICD-10-CM

## 2023-01-01 ENCOUNTER — Other Ambulatory Visit: Payer: Self-pay | Admitting: Obstetrics and Gynecology

## 2023-01-01 DIAGNOSIS — R928 Other abnormal and inconclusive findings on diagnostic imaging of breast: Secondary | ICD-10-CM

## 2023-01-14 ENCOUNTER — Other Ambulatory Visit: Payer: BC Managed Care – PPO

## 2023-01-15 ENCOUNTER — Ambulatory Visit
Admission: RE | Admit: 2023-01-15 | Discharge: 2023-01-15 | Disposition: A | Discharge status: Home / Self Care | Payer: Medicare Other | Source: Ambulatory Visit | Attending: Obstetrics and Gynecology | Admitting: Obstetrics and Gynecology

## 2023-01-15 DIAGNOSIS — R928 Other abnormal and inconclusive findings on diagnostic imaging of breast: Secondary | ICD-10-CM

## 2024-01-08 ENCOUNTER — Other Ambulatory Visit: Payer: Self-pay | Admitting: Obstetrics and Gynecology

## 2024-01-08 DIAGNOSIS — R928 Other abnormal and inconclusive findings on diagnostic imaging of breast: Secondary | ICD-10-CM

## 2024-02-03 ENCOUNTER — Ambulatory Visit
Admission: RE | Admit: 2024-02-03 | Discharge: 2024-02-03 | Disposition: A | Discharge status: Home / Self Care | Payer: Medicare Other | Source: Ambulatory Visit | Attending: Obstetrics and Gynecology | Admitting: Obstetrics and Gynecology

## 2024-02-03 DIAGNOSIS — R928 Other abnormal and inconclusive findings on diagnostic imaging of breast: Secondary | ICD-10-CM

## 2024-02-04 ENCOUNTER — Other Ambulatory Visit: Payer: Self-pay | Admitting: Obstetrics and Gynecology

## 2024-02-04 DIAGNOSIS — R921 Mammographic calcification found on diagnostic imaging of breast: Secondary | ICD-10-CM

## 2024-08-09 ENCOUNTER — Other Ambulatory Visit: Payer: Self-pay | Admitting: Obstetrics and Gynecology

## 2024-08-09 ENCOUNTER — Ambulatory Visit
Admission: RE | Admit: 2024-08-09 | Discharge: 2024-08-09 | Disposition: A | Discharge status: Home / Self Care | Source: Ambulatory Visit | Attending: Obstetrics and Gynecology | Admitting: Obstetrics and Gynecology

## 2024-08-09 DIAGNOSIS — R921 Mammographic calcification found on diagnostic imaging of breast: Secondary | ICD-10-CM

## 2024-08-11 ENCOUNTER — Ambulatory Visit
Admission: RE | Admit: 2024-08-11 | Discharge: 2024-08-11 | Disposition: A | Discharge status: Home / Self Care | Source: Ambulatory Visit | Attending: Obstetrics and Gynecology | Admitting: Obstetrics and Gynecology

## 2024-08-11 ENCOUNTER — Other Ambulatory Visit: Payer: Self-pay | Admitting: Medical Genetics

## 2024-08-11 ENCOUNTER — Inpatient Hospital Stay
Admission: RE | Admit: 2024-08-11 | Discharge: 2024-08-11 | Discharge status: Home / Self Care | Source: Ambulatory Visit | Attending: Obstetrics and Gynecology | Admitting: Obstetrics and Gynecology

## 2024-08-11 DIAGNOSIS — R921 Mammographic calcification found on diagnostic imaging of breast: Secondary | ICD-10-CM

## 2024-08-11 HISTORY — PX: BREAST BIOPSY: SHX20

## 2024-08-15 LAB — SURGICAL PATHOLOGY

## 2024-08-16 ENCOUNTER — Telehealth: Payer: Self-pay | Admitting: *Deleted

## 2024-08-16 NOTE — Telephone Encounter (Signed)
 LVM for patient to return my call in reference to [facility] on 10/1

## 2024-08-16 NOTE — Telephone Encounter (Signed)
 Spoke to patient to confirm upcoming morning [facility] clinic appointment on 10/1, paperwork will be sent via email  Gave location and time, also informed patient that the surgeon's office would be calling as well to get information from them similar to the packet that they will be receiving so make sure to do both.  Reminded patient that all providers will be coming to the clinic to see them HERE and if they had any questions to not hesitate to reach back out to myself or their navigators.

## 2024-08-22 ENCOUNTER — Encounter: Payer: Self-pay | Admitting: *Deleted

## 2024-08-22 ENCOUNTER — Telehealth: Payer: Self-pay | Admitting: *Deleted

## 2024-08-22 DIAGNOSIS — D0512 Intraductal carcinoma in situ of left breast: Secondary | ICD-10-CM | POA: Insufficient documentation

## 2024-08-22 NOTE — Progress Notes (Signed)
 Radiation Oncology         [phone] ________________________________  Name: TYESE FINKEN        MRN: [id number]  Date of Service: 08/24/2024 DOB: 07-01-1956  CC:Kip Righter, MD  Vanderbilt Ned, MD     REFERRING PHYSICIAN: Vanderbilt Ned, MD   DIAGNOSIS: Ductal carcinoma in situ of the L breast, D05.12   HISTORY OF PRESENT ILLNESS: ELLIANNAH WAYMENT is a 68 y.o. female seen in consultation for radiation therapy.  She presented after abnormality noted in the L breast on screening mammogram in early 2025. Subsequent L breast diagnostic mammogram on 02/03/24 revealed loosely grouped punctate and small round calcifications spanning 4.5 x 2.6 x 3.9 cm without associated mass or distortion and no definite linearity. The impression was that these were probably benign breast calcifications and short-term follow up was recommended. Repeat unilateral L breast diagnostic mammogram on 08/09/24 demonstrated pleomorphic calcifications in a segmental distribution in the UOQ of the breast extending from the middle to posterior third, spanning approximately 4.7 cm on MLO view and 5.3 cm on CC view without any suspicious mass or architectural distortion. These were considered suspicious calcifications and biopsy was recommended. Subsequent biopsy on 08/11/24 returned as low to intermediate grade DCIS.  She is being seen today preoperatively.  Estrogen exposure history:  Childbearing/breastfeeding:   Family history of cancer:  PREVIOUS RADIATION THERAPY: {EXAM; YES/NO:19492::No}  AUTOIMMUNE DISEASE: {EXAM; YES/NO:19492::No}  MEDICAL DEVICES: {EXAM; YES/NO:19492::No}  PREGNANCY: {EXAM; YES/NO:19492::No}   PAST MEDICAL HISTORY:  Past Medical History:  Diagnosis Date  . Goiter with hyperthyroidism   . Goiter, nontoxic, multinodular   . Graves' disease with exophthalmos   . Hypertension   . Hypothyroidism, postsurgical   . Thyroiditis, autoimmune        PAST SURGICAL  HISTORY: Past Surgical History:  Procedure Laterality Date  . BREAST BIOPSY Left 08/11/2024   MM LT BREAST BX W LOC DEV EA AD LESION IMG BX SPEC STEREO GUIDE 08/11/2024 [facility]  . BREAST BIOPSY Left 08/11/2024   MM LT BREAST BX W LOC DEV 1ST LESION IMAGE BX SPEC STEREO GUIDE 08/11/2024 [facility]  . THYROIDECTOMY, PARTIAL    . TUBAL LIGATION       FAMILY HISTORY: No family history on file.   SOCIAL HISTORY:  reports that she has never smoked. She has never used smokeless tobacco. She reports current alcohol use. She reports that she does not use drugs.   ALLERGIES: Patient has no known allergies.   MEDICATIONS:  Current Outpatient Medications  Medication Sig Dispense Refill  . amLODipine (NORVASC) 5 MG tablet 1 tablet (Patient not taking: Reported on 02/11/2022)    . atorvastatin (LIPITOR) 10 MG tablet 1 tablet (Patient not taking: Reported on 02/11/2022)    . cholecalciferol (VITAMIN D) 25 MCG (1000 UNIT) tablet 1 tablet    . clonazePAM (KLONOPIN) 0.5 MG tablet Take 0.5 mg by mouth at bedtime as needed.    SABRA esomeprazole (NEXIUM) 40 MG capsule Take 40 mg by mouth daily before breakfast. Reported on 03/03/2016 (Patient not taking: Reported on 02/11/2022)    . gabapentin (NEURONTIN) 300 MG capsule Take 300 mg by mouth 3 (three) times daily.    . hydrochlorothiazide (MICROZIDE) 12.5 MG capsule Take 12.5 mg by mouth daily.    SABRA imipramine (TOFRANIL) 50 MG tablet Take 50 mg by mouth at bedtime.    . metoprolol (LOPRESSOR) 50 MG tablet Take 50 mg by mouth 2 (two) times daily.    SABRA  Multiple Vitamin (MULTIVITAMIN) tablet Take 1 tablet by mouth daily. Reported on 03/03/2016    . saccharomyces boulardii (FLORASTOR) 250 MG capsule 2 capsules    . SYNTHROID  88 MCG tablet TAKE 1 TABLET(88 MCG) BY MOUTH DAILY BEFORE BREAKFAST 90 tablet 1  . triamcinolone  (KENALOG ) 0.1 %  (Patient not taking: Reported on 02/11/2022)     No current facility-administered medications for this visit.      REVIEW OF SYSTEMS: The patient reports that s***/he is doing well overall and a review of symptoms is otherwise negative.      PHYSICAL EXAM:  Wt Readings from Last 3 Encounters:  02/11/22 149 lb (67.6 kg)  01/17/21 146 lb (66.2 kg)  11/08/19 148 lb 9.6 oz (67.4 kg)   Temp Readings from Last 3 Encounters:  No data found for Temp   BP Readings from Last 3 Encounters:  02/11/22 118/74  01/17/21 120/70  11/08/19 116/76   Pulse Readings from Last 3 Encounters:  02/11/22 72  01/17/21 80  11/08/19 72    /10   Physical Exam   Breast Exam:  Well-healed lumpectomy/re-excision scar in the *** quadrant. No erythema, induration, or drainage. No palpable masses or nodularity at the surgical site. No skin changes, nipple inversion, or discharge.   ECOG = ***   LABORATORY DATA:  No results found for: WBC, HGB, HCT, MCV, PLT No results found for: NA, K, CL, CO2 No results found for: ALT, AST, GGT, ALKPHOS, BILITOT    RADIOGRAPHY: MM LT BREAST BX W LOC DEV 1ST LESION IMAGE BX SPEC STEREO GUIDE Addendum Date: 08/15/2024 ADDENDUM REPORT: 08/15/2024 10:51 ADDENDUM: PATHOLOGY revealed: Site 1. Breast, left, needle core biopsy, calcifications, upper outer quadrant, anterior extent, clip x - DUCTAL CARCINOMA IN SITU, LOW TO INTERMEDIATE GRADE, CRIBRIFORM TYPE - CALCIFICATIONS: NOT PRESENT - DCIS LENGTH: 0.2 CM / 2 MM. Pathology results are CONCORDANT with imaging findings, per Dr. Aliene Mir. PATHOLOGY revealed: Site 2. Breast, left, needle core biopsy, calcifications, upper outer quadrant, superior posterior extent, clip ribbon - DUCTAL CARCINOMA IN SITU, LOW TO INTERMEDIATE GRADE, CRIBRIFORM TYPE - NECROSIS: PRESENT - CALCIFICATIONS: PRESENT - DCIS LENGTH: 1.3 CM / 13 MM. Pathology results are CONCORDANT with imaging findings, per Dr. Aliene Mir. Pathology results and recommendations below were discussed with patient by telephone on 08/15/2024 by Mliss Molt  RN. Patient reported biopsy site within normal limits with slight tenderness at the site. Post biopsy care instructions were reviewed, questions were answered and my direct phone number was provided to patient. Patient was instructed to call [facility] if any concerns or questions arise related to the biopsy. RECOMMENDATION: Surgical and oncological consultation. Patient was referred to the [facility] at [facility] with appointment on August 24, 2024. Please note that if the patient is undergoing lumpectomy, she will require 3 site seed placement for bracketing the left breast calcifications. Pathology results reported by Jacquline Cooter RN on 08/15/2024. Electronically Signed   By: Aliene Lloyd M.D.   On: 08/15/2024 10:51   Result Date: 08/15/2024 CLINICAL DATA:  68 year old woman with segmentally distributed LEFT breast upper-outer quadrant calcifications, spanning 5.3 cm, presents for stereotactic guided biopsy of the anterior and posterior extents. EXAM: LEFT BREAST STEREOTACTIC CORE NEEDLE BIOPSY COMPARISON:  Previous exam(s). FINDINGS: The patient and I discussed the procedure of stereotactic-guided biopsy including benefits and alternatives. We discussed the high likelihood of a successful procedure. We discussed the risks of the procedure including  infection, bleeding, tissue injury, clip migration, and inadequate sampling. Informed written consent was given. The usual time out protocol was performed immediately prior to the procedure. SITE 1: ANTERIOR EXTENT OF LEFT UPPER OUTER QUADRANT BREAST CALCIFICATION Using sterile technique and 1% Lidocaine as local anesthetic, under stereotactic guidance, a 9 gauge vacuum assisted device was used to perform core needle biopsy of calcifications in the upper outer quadrant of the left breast using a lateral approach. Specimen radiograph was performed showing calcifications.  Specimens with calcifications are identified for pathology. Lesion quadrant: Upper outer quadrant At the conclusion of the procedure, X shaped tissue marker clip was deployed into the biopsy cavity. SITE 2: SUPERIOR POSTERIOR EXTENT OF LEFT UPPER OUTER QUADRANT BREAST CALCIFICATIONS Using sterile technique and 1% Lidocaine as local anesthetic, under stereotactic guidance, a 9 gauge vacuum assisted device was used to perform core needle biopsy of calcifications in the upper outer quadrant of the left breast using a lateral approach. Specimen radiograph was performed showing calcifications. Specimens with calcifications are identified for pathology. Lesion quadrant: Upper outer quadrant At the conclusion of the procedure, ribbon shaped tissue marker clip was deployed into the biopsy cavity. Follow-up 2-view mammogram was performed and dictated separately. IMPRESSION: Stereotactic-guided biopsy of the anterior and superior posterior extends of calcifications in the upper-outer quadrant of the LEFT breast. No apparent complications. Electronically Signed: By: Aliene Lloyd M.D. On: 08/11/2024 10:02   MM LT BREAST BX W LOC DEV EA AD LESION IMG BX SPEC STEREO GUIDE Addendum Date: 08/15/2024 ADDENDUM REPORT: 08/15/2024 10:51 ADDENDUM: PATHOLOGY revealed: Site 1. Breast, left, needle core biopsy, calcifications, upper outer quadrant, anterior extent, clip x - DUCTAL CARCINOMA IN SITU, LOW TO INTERMEDIATE GRADE, CRIBRIFORM TYPE - CALCIFICATIONS: NOT PRESENT - DCIS LENGTH: 0.2 CM / 2 MM. Pathology results are CONCORDANT with imaging findings, per Dr. Aliene Mir. PATHOLOGY revealed: Site 2. Breast, left, needle core biopsy, calcifications, upper outer quadrant, superior posterior extent, clip ribbon - DUCTAL CARCINOMA IN SITU, LOW TO INTERMEDIATE GRADE, CRIBRIFORM TYPE - NECROSIS: PRESENT - CALCIFICATIONS: PRESENT - DCIS LENGTH: 1.3 CM / 13 MM. Pathology results are CONCORDANT with imaging findings, per Dr. Aliene Mir.  Pathology results and recommendations below were discussed with patient by telephone on 08/15/2024 by Mliss Molt RN. Patient reported biopsy site within normal limits with slight tenderness at the site. Post biopsy care instructions were reviewed, questions were answered and my direct phone number was provided to patient. Patient was instructed to call [facility] if any concerns or questions arise related to the biopsy. RECOMMENDATION: Surgical and oncological consultation. Patient was referred to the [facility] at [facility] with appointment on August 24, 2024. Please note that if the patient is undergoing lumpectomy, she will require 3 site seed placement for bracketing the left breast calcifications. Pathology results reported by Jacquline Cooter RN on 08/15/2024. Electronically Signed   By: Aliene Lloyd M.D.   On: 08/15/2024 10:51   Result Date: 08/15/2024 CLINICAL DATA:  68 year old woman with segmentally distributed LEFT breast upper-outer quadrant calcifications, spanning 5.3 cm, presents for stereotactic guided biopsy of the anterior and posterior extents. EXAM: LEFT BREAST STEREOTACTIC CORE NEEDLE BIOPSY COMPARISON:  Previous exam(s). FINDINGS: The patient and I discussed the procedure of stereotactic-guided biopsy including benefits and alternatives. We discussed the high likelihood of a successful procedure. We discussed the risks of the procedure including infection, bleeding, tissue injury, clip migration, and inadequate sampling. Informed written consent was  given. The usual time out protocol was performed immediately prior to the procedure. SITE 1: ANTERIOR EXTENT OF LEFT UPPER OUTER QUADRANT BREAST CALCIFICATION Using sterile technique and 1% Lidocaine as local anesthetic, under stereotactic guidance, a 9 gauge vacuum assisted device was used to perform core needle biopsy of calcifications in the upper outer  quadrant of the left breast using a lateral approach. Specimen radiograph was performed showing calcifications. Specimens with calcifications are identified for pathology. Lesion quadrant: Upper outer quadrant At the conclusion of the procedure, X shaped tissue marker clip was deployed into the biopsy cavity. SITE 2: SUPERIOR POSTERIOR EXTENT OF LEFT UPPER OUTER QUADRANT BREAST CALCIFICATIONS Using sterile technique and 1% Lidocaine as local anesthetic, under stereotactic guidance, a 9 gauge vacuum assisted device was used to perform core needle biopsy of calcifications in the upper outer quadrant of the left breast using a lateral approach. Specimen radiograph was performed showing calcifications. Specimens with calcifications are identified for pathology. Lesion quadrant: Upper outer quadrant At the conclusion of the procedure, ribbon shaped tissue marker clip was deployed into the biopsy cavity. Follow-up 2-view mammogram was performed and dictated separately. IMPRESSION: Stereotactic-guided biopsy of the anterior and superior posterior extends of calcifications in the upper-outer quadrant of the LEFT breast. No apparent complications. Electronically Signed: By: Aliene Lloyd M.D. On: 08/11/2024 10:02   MM CLIP PLACEMENT LEFT Result Date: 08/11/2024 CLINICAL DATA:  Status post stereotactic guided biopsy of LEFT breast calcifications EXAM: 3D DIAGNOSTIC LEFT MAMMOGRAM POST STEREOTACTIC BIOPSY COMPARISON:  Previous exam(s). ACR Breast Density Category c: The breasts are heterogeneously dense, which may obscure small masses. FINDINGS: 3D Mammographic images were obtained following stereotactic guided biopsy of anterior and superior posterior extents of LEFT upper outer quadrant calcifications. The biopsy marking clip is in expected position at the site of biopsy. IMPRESSION: Appropriate positioning of the X and ribbon shaped biopsy marking clips at the site of biopsy in the upper-outer quadrant of the LEFT breast.  Final Assessment: Post Procedure Mammograms for Marker Placement Electronically Signed   By: Aliene Lloyd M.D.   On: 08/11/2024 10:03   MM 3D DIAGNOSTIC MAMMOGRAM UNILATERAL LEFT BREAST Result Date: 08/09/2024 CLINICAL DATA:  68 year old female here for follow-up evaluation of left breast calcifications. EXAM: DIGITAL DIAGNOSTIC UNILATERAL LEFT MAMMOGRAM WITH TOMOSYNTHESIS AND CAD TECHNIQUE: Left digital diagnostic mammography and breast tomosynthesis was performed. Additional 2D spot magnification views and XCCL views were obtained. The images were evaluated with computer-aided detection. COMPARISON:  Previous exam(s). ACR Breast Density Category c: The breasts are heterogeneously dense, which may obscure small masses. FINDINGS: Left Mammogram: Spot magnification views demonstrate pleomorphic calcifications in a segmental distribution about the upper-outer breast extending from the middle to posterior third, spanning approximately 4.7 cm on the MLO view and 5.3 cm on the CC view. No suspicious mass or architectural distortion is identified. IMPRESSION: Suspicious left breast calcifications. RECOMMENDATION: Left breast stereotactic biopsy, 2 site biopsy. Consider sampling the anterior-most calcifications on either the CC or MLO view and the superior calcifications on the MLO view. If the biopsy demonstrates atypia or malignancy and excision is recommended, recommend bracketed localization to include the posterior aspect of the calcifications. I have discussed the findings and recommendations with the patient. If applicable, a reminder letter will be sent to the patient regarding the next appointment. BI-RADS CATEGORY  4: Suspicious. Electronically Signed   By: Curtistine Noble   On: 08/09/2024 11:46     PATHOLOGY:     IMPRESSION/PLAN:   Patient with Stage  0 [id number] ductal carcinoma in situ of the L breast who is seen pre-operatively for consideration of adjuvant radiation therapy. We discussed the role  of breast irradiation in reducing the risk of local recurrence and improving long-term disease control.  We reviewed the logistics of treatment in detail, including the need for a CT simulation for treatment planning, followed by several days for contouring, dosimetry, and quality assurance prior to starting therapy. Anticipated course of treatment will involve daily sessions, Monday through Friday, over approximately *** weeks. Each session will last only a few minutes although set up time is longer such that she should expect to be in the department around 45 minutes.  She will see me once a week to ensure she is safe to continue radiation and that we can manage any side effects she may be experiencing.  We discussed acute and subacute side effects which are generally gradual in onset and typically include fatigue skin erythema, tanning or hyperpigmentation, breast edema or firmness, mild tenderness.  Less common but possible side effects include desquamation of the skin, decreased range of motion of this shoulder, and transient changes in breast size texture.  Long-term risk such as cosmetic changes, rare risk of rib fracture, cardiopulmonary effects and very rare secondary malignancies were also reviewed.  Reassured patient that most acute side effects improve over weeks to months after completing therapy.    Patient verbalized understanding of these risks and benefits and she is in agreement with the plan. I will see her in follow-up after surgery. Final treatment planning will depend on her postoperative pathology report, but we anticipate adjuvant whole breast radiation. Do not anticipate the need to radiate her axilla or any nodal regions.   --  Total time spent today in preparation for this visit was *** minutes. This included patient care, imaging and path review, documentation, multidisciplinary discussion and coordination of care and follow up.    Estefana HERO. Maritza, M.D.

## 2024-08-22 NOTE — Telephone Encounter (Signed)
 Opened in error

## 2024-08-23 ENCOUNTER — Encounter: Payer: Self-pay | Admitting: *Deleted

## 2024-08-24 ENCOUNTER — Encounter: Payer: Self-pay | Admitting: *Deleted

## 2024-08-24 ENCOUNTER — Inpatient Hospital Stay: Attending: Hematology and Oncology

## 2024-08-24 ENCOUNTER — Ambulatory Visit: Admitting: Physical Therapy

## 2024-08-24 ENCOUNTER — Ambulatory Visit
Admission: RE | Admit: 2024-08-24 | Discharge: 2024-08-24 | Disposition: A | Discharge status: Home / Self Care | Source: Ambulatory Visit | Attending: Radiation Oncology | Admitting: Radiation Oncology

## 2024-08-24 ENCOUNTER — Ambulatory Visit: Payer: PRIVATE HEALTH INSURANCE

## 2024-08-24 ENCOUNTER — Ambulatory Visit: Payer: Self-pay | Admitting: Surgery

## 2024-08-24 ENCOUNTER — Ambulatory Visit: Admitting: Hematology and Oncology

## 2024-08-24 ENCOUNTER — Inpatient Hospital Stay: Admitting: Licensed Clinical Social Worker

## 2024-08-24 VITALS — BP 129/79 | HR 77 | Temp 97.6°F | Resp 16 | Ht 65.35 in | Wt 152.7 lb

## 2024-08-24 DIAGNOSIS — Z170 Estrogen receptor positive status [ER+]: Secondary | ICD-10-CM | POA: Insufficient documentation

## 2024-08-24 DIAGNOSIS — Z1721 Progesterone receptor positive status: Secondary | ICD-10-CM

## 2024-08-24 DIAGNOSIS — D0512 Intraductal carcinoma in situ of left breast: Secondary | ICD-10-CM

## 2024-08-24 DIAGNOSIS — Z800 Family history of malignant neoplasm of digestive organs: Secondary | ICD-10-CM | POA: Insufficient documentation

## 2024-08-24 DIAGNOSIS — Z808 Family history of malignant neoplasm of other organs or systems: Secondary | ICD-10-CM | POA: Insufficient documentation

## 2024-08-24 DIAGNOSIS — Z803 Family history of malignant neoplasm of breast: Secondary | ICD-10-CM | POA: Insufficient documentation

## 2024-08-24 DIAGNOSIS — C801 Malignant (primary) neoplasm, unspecified: Secondary | ICD-10-CM

## 2024-08-24 HISTORY — DX: Malignant (primary) neoplasm, unspecified: C80.1

## 2024-08-24 LAB — CMP (CANCER CENTER ONLY)
ALT: 44 U/L (ref 0–44)
AST: 33 U/L (ref 15–41)
Albumin: 4.7 g/dL (ref 3.5–5.0)
Alkaline Phosphatase: 76 U/L (ref 38–126)
Anion gap: 5 (ref 5–15)
BUN: 21 mg/dL (ref 8–23)
CO2: 32 mmol/L (ref 22–32)
Calcium: 9.8 mg/dL (ref 8.9–10.3)
Chloride: 99 mmol/L (ref 98–111)
Creatinine: 0.93 mg/dL (ref 0.44–1.00)
GFR, Estimated: >60 mL/min (ref >60)
Glucose, Bld: 109 mg/dL — ABNORMAL HIGH (ref 70–99)
Potassium: 4 mmol/L (ref 3.5–5.1)
Sodium: 136 mmol/L (ref 135–145)
Total Bilirubin: 0.5 mg/dL (ref 0.0–1.2)
Total Protein: 7.7 g/dL (ref 6.5–8.1)

## 2024-08-24 LAB — CBC WITH DIFFERENTIAL (CANCER CENTER ONLY)
Abs Immature Granulocytes: 0.04 K/uL (ref 0.00–0.07)
Basophils Absolute: 0 K/uL (ref 0.0–0.1)
Basophils Relative: 1 %
Eosinophils Absolute: 0.1 K/uL (ref 0.0–0.5)
Eosinophils Relative: 2 %
HCT: 40.6 % (ref 36.0–46.0)
Hemoglobin: 14 g/dL (ref 12.0–15.0)
Immature Granulocytes: 1 %
Lymphocytes Relative: 34 %
Lymphs Abs: 1.7 K/uL (ref 0.7–4.0)
MCH: 32.9 pg (ref 26.0–34.0)
MCHC: 34.5 g/dL (ref 30.0–36.0)
MCV: 95.3 fL (ref 80.0–100.0)
Monocytes Absolute: 0.5 K/uL (ref 0.1–1.0)
Monocytes Relative: 10 %
Neutro Abs: 2.6 K/uL (ref 1.7–7.7)
Neutrophils Relative %: 52 %
Platelet Count: 281 K/uL (ref 150–400)
RBC: 4.26 MIL/uL (ref 3.87–5.11)
RDW: 12.7 % (ref 11.5–15.5)
WBC Count: 5 K/uL (ref 4.0–10.5)
nRBC: 0 % (ref 0.0–0.2)

## 2024-08-24 LAB — GENETIC SCREENING ORDER

## 2024-08-24 NOTE — Progress Notes (Signed)
 [facility] CONSULT NOTE  Patient Care Team: Kip Righter, MD as PCP - General (Family Medicine) Livingston Rigg, MD as Consulting Physician (Dermatology) Gerome, Devere HERO, RN as Oncology Nurse Navigator Tyree Nanetta SAILOR, RN as Oncology Nurse Navigator Vanderbilt Ned, MD as Consulting Physician (General Surgery) Loretha Ash, MD as Consulting Physician (Hematology and Oncology) Maritza Stagger, MD as Consulting Physician (Radiation Oncology)  CHIEF COMPLAINTS/PURPOSE OF CONSULTATION:  DCIS  ASSESSMENT & PLAN:   This is a very pleasant 68 yr old female with DCIS, ER positive and PR positive referred to [facility] for recommendations.  Pathology review: I discussed with the patient the difference between DCIS and invasive breast cancer. It is considered a precancerous lesion. DCIS is classified as a Stage 0 breast cancer. It is generally detected through mammograms as calcifications. We also discussed the importance of ER and PR receptors and their implications to adjuvant treatment options.  It is anticipated that if not treated, 20-30% of DCIS can develop into invasive breast cancer.  Recommendation: 1. Breast conserving surgery 2. Followed by adjuvant radiation therapy 3. Followed by antiestrogen therapy with tamoxifen/aromatase inhibitors based on menopausal status 5 years  Tamoxifen counseling: We discussed the risks and benefits of tamoxifen. These include but not limited to insomnia, hot flashes, mood changes, vaginal dryness, and weight gain. Although rare, serious side effects including endometrial cancer, risk of blood clots were also discussed. We believe that the benefits far outweigh the risks. Patient understands these risks and consented to starting treatment. Planned treatment duration is 5 years.  Aromatase inhibitors counseling: We have discussed the mechanism of action of aromatase inhibitors today.  We have discussed adverse effects including but not  limited to menopausal symptoms, increased risk of osteoporosis and fractures, cardiovascular events, arthralgias and myalgias.  We do believe that the benefits far outweigh the risks.  Plan treatment duration of 5 years.  She will RTC to see after radiation to initiate antiestrogen therapy. All her questions were answered to the best of my knowledge.  HISTORY OF PRESENTING ILLNESS:  Beth Fuller 68 y.o. female is here because of left breast DCIS  Discussed the use of AI scribe software for clinical note transcription with the patient, who gave verbal consent to proceed.  History of Present Illness Beth Fuller is a 68 year old female with ductal carcinoma in situ (DCIS) who presents for a consultation regarding treatment options. She is accompanied by Mr. Alleyne. She has been diagnosed with ductal carcinoma in situ (DCIS) of the breast, characterized by calcifications scattered over approximately four centimeters. Biopsies of the anterior and posterior areas confirmed noninvasive breast cancer. The DCIS is estrogen and progesterone receptor positive.  She has a history of thyroid  surgery in 1984 and is currently on thyroid  supplements. She also has a history of high blood pressure for which she takes medication. She experiences neck pain, for which she takes gabapentin, and notes that exercise helps alleviate the pain.  She is semi-retired, working as a Health visitor, which allows her flexibility in her work schedule. She has not undergone hormone replacement therapy and has not experienced any swelling in her legs. She reports some lumpy areas in her breast, which she attributes to recent biopsy, but denies any other significant symptoms.  All other systems were reviewed with the patient and are negative.  MEDICAL HISTORY:  Past Medical History:  Diagnosis Date   Goiter with hyperthyroidism    Goiter, nontoxic, multinodular  Graves' disease with exophthalmos     Hypertension    Hypothyroidism, postsurgical    Thyroiditis, autoimmune     SURGICAL HISTORY: Past Surgical History:  Procedure Laterality Date   BREAST BIOPSY Left 08/11/2024   MM LT BREAST BX W LOC DEV EA AD LESION IMG BX SPEC STEREO GUIDE 08/11/2024 [facility]   BREAST BIOPSY Left 08/11/2024   MM LT BREAST BX W LOC DEV 1ST LESION IMAGE BX SPEC STEREO GUIDE 08/11/2024 [facility]   THYROIDECTOMY, PARTIAL  1984   TUBAL LIGATION  1987    SOCIAL HISTORY: Social History   Socioeconomic History   Marital status: Divorced    Spouse name: Not on file   Number of children: Not on file   Years of education: Not on file   Highest education level: Not on file  Occupational History   Not on file  Tobacco Use   Smoking status: Never   Smokeless tobacco: Never  Vaping Use   Vaping status: Never Used  Substance and Sexual Activity   Alcohol use: Yes    Comment: occ wine   Drug use: Never   Sexual activity: Not on file  Other Topics Concern   Not on file  Social History Narrative   Not on file   Social Drivers of Health   Financial Resource Strain: Not on file  Food Insecurity: Not on file  Transportation Needs: Not on file  Physical Activity: Not on file  Stress: Not on file  Social Connections: Not on file  Intimate Partner Violence: Not on file    FAMILY HISTORY: No family history on file.  ALLERGIES:  has no known allergies.  MEDICATIONS:  Current Outpatient Medications  Medication Sig Dispense Refill   amLODipine (NORVASC) 5 MG tablet 1 tablet (Patient not taking: Reported on 02/11/2022)     atorvastatin (LIPITOR) 10 MG tablet 1 tablet (Patient not taking: Reported on 02/11/2022)     cholecalciferol (VITAMIN D) 25 MCG (1000 UNIT) tablet 1 tablet     clonazePAM (KLONOPIN) 0.5 MG tablet Take 0.5 mg by mouth at bedtime as needed.     esomeprazole (NEXIUM) 40 MG capsule Take 40 mg by mouth daily before breakfast. Reported on 03/03/2016 (Patient not  taking: Reported on 02/11/2022)     gabapentin (NEURONTIN) 300 MG capsule Take 300 mg by mouth 3 (three) times daily.     hydrochlorothiazide (MICROZIDE) 12.5 MG capsule Take 12.5 mg by mouth daily.     imipramine (TOFRANIL) 50 MG tablet Take 50 mg by mouth at bedtime.     metoprolol (LOPRESSOR) 50 MG tablet Take 50 mg by mouth 2 (two) times daily.     Multiple Vitamin (MULTIVITAMIN) tablet Take 1 tablet by mouth daily. Reported on 03/03/2016     saccharomyces boulardii (FLORASTOR) 250 MG capsule 2 capsules     SYNTHROID  88 MCG tablet TAKE 1 TABLET(88 MCG) BY MOUTH DAILY BEFORE BREAKFAST 90 tablet 1   triamcinolone  (KENALOG ) 0.1 %  (Patient not taking: Reported on 02/11/2022)     No current facility-administered medications for this visit.     PHYSICAL EXAMINATION: ECOG PERFORMANCE STATUS: 0 - Asymptomatic  Vitals:   08/24/24 0841  BP: 129/79  Pulse: 77  Resp: 16  Temp: 97.6 F (36.4 C)  SpO2: 97%   Filed Weights   08/24/24 0841  Weight: 152 lb 11.2 oz (69.3 kg)    GENERAL:alert, no distress and comfortable NECK: supple, thyroid  normal size, non-tender, without nodularity LYMPH:  no palpable  lymphadenopathy in the cervical, axillary LUNGS: clear to auscultation and percussion with normal breathing effort Bilateral breasts inspected and palpated. Post biopsy changes noted in the left breast.  LABORATORY DATA:  I have reviewed the data as listed No results found for: WBC, HGB, HCT, MCV, PLT   Chemistry   No results found for: NA, K, CL, CO2, BUN, CREATININE, GLU No results found for: CALCIUM, ALKPHOS, AST, ALT, BILITOT     RADIOGRAPHIC STUDIES: I have personally reviewed the radiological images as listed and agreed with the findings in the report. MM LT BREAST BX W LOC DEV 1ST LESION IMAGE BX SPEC STEREO GUIDE Addendum Date: 08/15/2024 ADDENDUM REPORT: 08/15/2024 10:51 ADDENDUM: PATHOLOGY revealed: Site 1. Breast, left, needle core biopsy,  calcifications, upper outer quadrant, anterior extent, clip x - DUCTAL CARCINOMA IN SITU, LOW TO INTERMEDIATE GRADE, CRIBRIFORM TYPE - CALCIFICATIONS: NOT PRESENT - DCIS LENGTH: 0.2 CM / 2 MM. Pathology results are CONCORDANT with imaging findings, per Dr. Aliene Mir. PATHOLOGY revealed: Site 2. Breast, left, needle core biopsy, calcifications, upper outer quadrant, superior posterior extent, clip ribbon - DUCTAL CARCINOMA IN SITU, LOW TO INTERMEDIATE GRADE, CRIBRIFORM TYPE - NECROSIS: PRESENT - CALCIFICATIONS: PRESENT - DCIS LENGTH: 1.3 CM / 13 MM. Pathology results are CONCORDANT with imaging findings, per Dr. Aliene Mir. Pathology results and recommendations below were discussed with patient by telephone on 08/15/2024 by Mliss Molt RN. Patient reported biopsy site within normal limits with slight tenderness at the site. Post biopsy care instructions were reviewed, questions were answered and my direct phone number was provided to patient. Patient was instructed to call [facility] if any concerns or questions arise related to the biopsy. RECOMMENDATION: Surgical and oncological consultation. Patient was referred to [facility] with appointment on August 24, 2024. Please note that if the patient is undergoing lumpectomy, she will require 3 site seed placement for bracketing the left breast calcifications. Pathology results reported by Jacquline Cooter RN on 08/15/2024. Electronically Signed   By: Aliene Lloyd M.D.   On: 08/15/2024 10:51   Result Date: 08/15/2024 CLINICAL DATA:  68 year old woman with segmentally distributed LEFT breast upper-outer quadrant calcifications, spanning 5.3 cm, presents for stereotactic guided biopsy of the anterior and posterior extents. EXAM: LEFT BREAST STEREOTACTIC CORE NEEDLE BIOPSY COMPARISON:  Previous exam(s). FINDINGS: The patient and I discussed the procedure of  stereotactic-guided biopsy including benefits and alternatives. We discussed the high likelihood of a successful procedure. We discussed the risks of the procedure including infection, bleeding, tissue injury, clip migration, and inadequate sampling. Informed written consent was given. The usual time out protocol was performed immediately prior to the procedure. SITE 1: ANTERIOR EXTENT OF LEFT UPPER OUTER QUADRANT BREAST CALCIFICATION Using sterile technique and 1% Lidocaine as local anesthetic, under stereotactic guidance, a 9 gauge vacuum assisted device was used to perform core needle biopsy of calcifications in the upper outer quadrant of the left breast using a lateral approach. Specimen radiograph was performed showing calcifications. Specimens with calcifications are identified for pathology. Lesion quadrant: Upper outer quadrant At the conclusion of the procedure, X shaped tissue marker clip was deployed into the biopsy cavity. SITE 2: SUPERIOR POSTERIOR EXTENT OF LEFT UPPER OUTER QUADRANT BREAST CALCIFICATIONS Using sterile technique and 1% Lidocaine as local anesthetic, under stereotactic guidance, a 9 gauge vacuum assisted device was used to perform core needle biopsy of calcifications in the upper outer quadrant of the left breast  using a lateral approach. Specimen radiograph was performed showing calcifications. Specimens with calcifications are identified for pathology. Lesion quadrant: Upper outer quadrant At the conclusion of the procedure, ribbon shaped tissue marker clip was deployed into the biopsy cavity. Follow-up 2-view mammogram was performed and dictated separately. IMPRESSION: Stereotactic-guided biopsy of the anterior and superior posterior extends of calcifications in the upper-outer quadrant of the LEFT breast. No apparent complications. Electronically Signed: By: Aliene Lloyd M.D. On: 08/11/2024 10:02   MM LT BREAST BX W LOC DEV EA AD LESION IMG BX SPEC STEREO GUIDE Addendum Date:  08/15/2024 ADDENDUM REPORT: 08/15/2024 10:51 ADDENDUM: PATHOLOGY revealed: Site 1. Breast, left, needle core biopsy, calcifications, upper outer quadrant, anterior extent, clip x - DUCTAL CARCINOMA IN SITU, LOW TO INTERMEDIATE GRADE, CRIBRIFORM TYPE - CALCIFICATIONS: NOT PRESENT - DCIS LENGTH: 0.2 CM / 2 MM. Pathology results are CONCORDANT with imaging findings, per Dr. Aliene Mir. PATHOLOGY revealed: Site 2. Breast, left, needle core biopsy, calcifications, upper outer quadrant, superior posterior extent, clip ribbon - DUCTAL CARCINOMA IN SITU, LOW TO INTERMEDIATE GRADE, CRIBRIFORM TYPE - NECROSIS: PRESENT - CALCIFICATIONS: PRESENT - DCIS LENGTH: 1.3 CM / 13 MM. Pathology results are CONCORDANT with imaging findings, per Dr. Aliene Mir. Pathology results and recommendations below were discussed with patient by telephone on 08/15/2024 by Mliss Molt RN. Patient reported biopsy site within normal limits with slight tenderness at the site. Post biopsy care instructions were reviewed, questions were answered and my direct phone number was provided to patient. Patient was instructed to call [facility] if any concerns or questions arise related to the biopsy. RECOMMENDATION: Surgical and oncological consultation. Patient was referred to the [facility] at [facility] with appointment on August 24, 2024. Please note that if the patient is undergoing lumpectomy, she will require 3 site seed placement for bracketing the left breast calcifications. Pathology results reported by Jacquline Cooter RN on 08/15/2024. Electronically Signed   By: Aliene Lloyd M.D.   On: 08/15/2024 10:51   Result Date: 08/15/2024 CLINICAL DATA:  68 year old woman with segmentally distributed LEFT breast upper-outer quadrant calcifications, spanning 5.3 cm, presents for stereotactic guided biopsy of the anterior and posterior extents. EXAM: LEFT BREAST STEREOTACTIC  CORE NEEDLE BIOPSY COMPARISON:  Previous exam(s). FINDINGS: The patient and I discussed the procedure of stereotactic-guided biopsy including benefits and alternatives. We discussed the high likelihood of a successful procedure. We discussed the risks of the procedure including infection, bleeding, tissue injury, clip migration, and inadequate sampling. Informed written consent was given. The usual time out protocol was performed immediately prior to the procedure. SITE 1: ANTERIOR EXTENT OF LEFT UPPER OUTER QUADRANT BREAST CALCIFICATION Using sterile technique and 1% Lidocaine as local anesthetic, under stereotactic guidance, a 9 gauge vacuum assisted device was used to perform core needle biopsy of calcifications in the upper outer quadrant of the left breast using a lateral approach. Specimen radiograph was performed showing calcifications. Specimens with calcifications are identified for pathology. Lesion quadrant: Upper outer quadrant At the conclusion of the procedure, X shaped tissue marker clip was deployed into the biopsy cavity. SITE 2: SUPERIOR POSTERIOR EXTENT OF LEFT UPPER OUTER QUADRANT BREAST CALCIFICATIONS Using sterile technique and 1% Lidocaine as local anesthetic, under stereotactic guidance, a 9 gauge vacuum assisted device was used to perform core needle biopsy of calcifications in the upper outer quadrant of the left breast using a lateral approach. Specimen radiograph was performed showing calcifications. Specimens with calcifications  are identified for pathology. Lesion quadrant: Upper outer quadrant At the conclusion of the procedure, ribbon shaped tissue marker clip was deployed into the biopsy cavity. Follow-up 2-view mammogram was performed and dictated separately. IMPRESSION: Stereotactic-guided biopsy of the anterior and superior posterior extends of calcifications in the upper-outer quadrant of the LEFT breast. No apparent complications. Electronically Signed: By: Aliene Lloyd M.D. On:  08/11/2024 10:02   MM CLIP PLACEMENT LEFT Result Date: 08/11/2024 CLINICAL DATA:  Status post stereotactic guided biopsy of LEFT breast calcifications EXAM: 3D DIAGNOSTIC LEFT MAMMOGRAM POST STEREOTACTIC BIOPSY COMPARISON:  Previous exam(s). ACR Breast Density Category c: The breasts are heterogeneously dense, which may obscure small masses. FINDINGS: 3D Mammographic images were obtained following stereotactic guided biopsy of anterior and superior posterior extents of LEFT upper outer quadrant calcifications. The biopsy marking clip is in expected position at the site of biopsy. IMPRESSION: Appropriate positioning of the X and ribbon shaped biopsy marking clips at the site of biopsy in the upper-outer quadrant of the LEFT breast. Final Assessment: Post Procedure Mammograms for Marker Placement Electronically Signed   By: Aliene Lloyd M.D.   On: 08/11/2024 10:03   MM 3D DIAGNOSTIC MAMMOGRAM UNILATERAL LEFT BREAST Result Date: 08/09/2024 CLINICAL DATA:  68 year old female here for follow-up evaluation of left breast calcifications. EXAM: DIGITAL DIAGNOSTIC UNILATERAL LEFT MAMMOGRAM WITH TOMOSYNTHESIS AND CAD TECHNIQUE: Left digital diagnostic mammography and breast tomosynthesis was performed. Additional 2D spot magnification views and XCCL views were obtained. The images were evaluated with computer-aided detection. COMPARISON:  Previous exam(s). ACR Breast Density Category c: The breasts are heterogeneously dense, which may obscure small masses. FINDINGS: Left Mammogram: Spot magnification views demonstrate pleomorphic calcifications in a segmental distribution about the upper-outer breast extending from the middle to posterior third, spanning approximately 4.7 cm on the MLO view and 5.3 cm on the CC view. No suspicious mass or architectural distortion is identified. IMPRESSION: Suspicious left breast calcifications. RECOMMENDATION: Left breast stereotactic biopsy, 2 site biopsy. Consider sampling the  anterior-most calcifications on either the CC or MLO view and the superior calcifications on the MLO view. If the biopsy demonstrates atypia or malignancy and excision is recommended, recommend bracketed localization to include the posterior aspect of the calcifications. I have discussed the findings and recommendations with the patient. If applicable, a reminder letter will be sent to the patient regarding the next appointment. BI-RADS CATEGORY  4: Suspicious. Electronically Signed   By: Curtistine Noble   On: 08/09/2024 11:46    All questions were answered. The patient knows to call the clinic with any problems, questions or concerns. I spent 45 minutes in the care of this patient including H and P, review of records, counseling and coordination of care.     Amber Stalls, MD 08/24/2024 9:46 AM

## 2024-08-24 NOTE — Progress Notes (Addendum)
 REFERRING PROVIDER: Kip Righter, MD [address] [place],  [place] [address]  PRIMARY PROVIDER:  Kip Righter, MD  PRIMARY REASON FOR VISIT:  No diagnosis found.  HISTORY OF PRESENT ILLNESS:   Beth Fuller, a 68 y.o. female, was seen for a [place] cancer genetics consultation at the request of surgeon Debby Shipper, MD due to a personal history of breast cancer and a family history of breast and pancreatic cancer. She was seen today the at [facility] to discuss the possibility of a hereditary predisposition to cancer, genetic testing, and to further clarify her future cancer risks, as well as potential cancer risks for family members.  Diagnosis: In September 2025, at the age of 58, Beth Fuller was diagnosed with DCIS, ER+/PR+ of the left breast. The treatment plan includes a lumpectomy and radiation therapy followed by antiestrogen therapy.  CANCER HISTORY:  Oncology History  Ductal carcinoma in situ (DCIS) of left breast  08/22/2024 Initial Diagnosis   Ductal carcinoma in situ (DCIS) of left breast   08/24/2024 Cancer Staging   Staging form: Breast, AJCC 8th Edition - Clinical: Stage 0 (cTis (DCIS), cN0, cM0, G2, ER+, PR+) - Signed by Loretha Ash, MD on 08/24/2024 Nuclear grade: G2 Histologic grading system: 3 grade system   RISK FACTORS:  Menarche was at age 88.  First live birth at age 50.  OCP use for approximately 7 years.  Menopausal status: postmenopausal.  Colonoscopy: yes; normal (12/09/2023) Interpretation:Diverticulosis in the sigmoid colon:internal hemorrhoids:the exam was otherwise normal-repeat colon in 5 years  Mammogram within the last year: yes. Number of breast biopsies: 1. Tobacco Use: Never Past Medical History:  Diagnosis Date   Family history of breast cancer    Paternal Cousin   Family history of pancreatic cancer    Paternal Uncle   Goiter with hyperthyroidism    Goiter, nontoxic, multinodular     Graves' disease with exophthalmos    Hypertension    Hypothyroidism, postsurgical    Thyroiditis, autoimmune     Past Surgical History:  Procedure Laterality Date   BREAST BIOPSY Left 08/11/2024   MM LT BREAST BX W LOC DEV EA AD LESION IMG BX SPEC STEREO GUIDE 08/11/2024 [facility]   BREAST BIOPSY Left 08/11/2024   MM LT BREAST BX W LOC DEV 1ST LESION IMAGE BX SPEC STEREO GUIDE 08/11/2024 [facility]   THYROIDECTOMY, PARTIAL  1984   TUBAL LIGATION  1987    Social History   Socioeconomic History   Marital status: Divorced    Spouse name: Not on file   Number of children: Not on file   Years of education: Not on file   Highest education level: Not on file  Occupational History   Not on file  Tobacco Use   Smoking status: Never   Smokeless tobacco: Never  Vaping Use   Vaping status: Never Used  Substance and Sexual Activity   Alcohol use: Yes    Comment: occ wine 4-6 glasses/week   Drug use: Never   Sexual activity: Not on file  Other Topics Concern   Not on file  Social History Narrative   Not on file   Social Drivers of Health   Financial Resource Strain: Not on file  Food Insecurity: Not on file  Transportation Needs: Not on file  Physical Activity: Not on file  Stress: Not on file  Social Connections: Not on file     FAMILY HISTORY:  We obtained a detailed, 4-generation family  history.  Significant diagnoses are listed below: Family History  Problem Relation Age of Onset   Lung cancer Paternal Grandfather    Colon cancer Paternal Grandmother 75   Sudden infant death Grandchild 0   Brain cancer Paternal Aunt 39   Pancreatic cancer Paternal Uncle 69   Lung cancer Paternal Uncle    Breast cancer Paternal Cousin 39    Pedigree Summary:  Beth Fuller has a family history of: Breast cancer- paternal 1st cousin diagnosed at age 59 Brain cancer - paternal aunt at 69 Pancreatic cancer - paternal uncle at 15 Colon Cancer - paternal grandmother  had colon cancer at 86 Lung Cancer - paternal grandfather (non-smoker) Beth Fuller is aware of relatives completing genetic testing for hereditary cancer risks.  Two paternal 1st cousins with BRCA variants that were maternally inherited. (Meaning that Beth Fuller is not at risk for these variants, as these are the children of her paternal uncle and she is not biologically related to their mother.) There is no reported Ashkenazi Jewish ancestry.  There is no known consanguinity.  GENETIC COUNSELING ASSESSMENT: Beth Fuller is a 68 y.o. female with a personal and family history of cancer which is somewhat suggestive of a hereditary cancer predisposition syndrome. We, therefore, discussed and recommended the following at today's visit.   DISCUSSION: We discussed that, in general, most cancer is not inherited in families, but instead is sporadic or familial. Sporadic cancers occur by chance and typically happen at older ages (>50 years) as this type of cancer is caused by genetic changes acquired during an individual's lifetime. Some families have more cancers than would be expected by chance; however, the ages or types of cancer are not consistent with a known genetic mutation or known genetic mutations have been ruled out. This type of familial cancer is thought to be due to a combination of multiple genetic, environmental, hormonal, and lifestyle factors. While this combination of factors likely increases the risk of cancer, the exact source of this risk is not currently identifiable or testable.  We discussed that 5-10% of cancer is the result of germline (heritable) genetic variants, with most cases associated with BRCA1/BRCA2. There are other genes that can be associated with hereditary cancer syndromes. We discussed that testing is beneficial for several reasons including knowing how to follow individuals after completing their treatment, identifying whether potential treatment options such as PARP  inhibitors would be beneficial, and understanding if other family members could be at risk for cancer and allow them to undergo genetic testing.   We reviewed the characteristics, features and inheritance patterns of hereditary cancer syndromes. We also discussed genetic testing, including the appropriate family members to test, the process of testing, insurance coverage and turn-around-time for results. We discussed the implications of a negative, positive, carrier and/or variant of uncertain significant result. Beth Fuller  was offered a common hereditary cancer panel (40 genes) and an expanded pan-cancer panel (70 genes). Beth Fuller was informed of the benefits and limitations of each panel, including that expanded pan-cancer panels contain genes that do not have clear management guidelines at this point in time.  We also discussed that as the number of genes included on a panel increases, the chances of variants of uncertain significance increases.  Genetic Testing Consent:  After considering the risks, benefits, and limitations, Beth Fuller provided informed consent to pursue genetic testing.  A blood sample was sent to [facility] Multi-Cancer+RNA Panel. Results should be available within approximately 2-3 weeks' time, at which  point they will be disclosed by telephone to Beth Fuller , as will any additional recommendations warranted by these results. Beth Fuller will receive a summary of her genetic counseling visit and a copy of her results once available. This information will also be available in Epic.  The Multi-Cancer + RNA Panel offered by [facility] includes sequencing and/or deletion/duplication analysis of the following 70 genes:  AIP*, ALK, APC*, ATM*, AXIN2*, BAP1*, BARD1*, BLM*, BMPR1A*, BRCA1*, BRCA2*, BRIP1*, CDC73*, CDH1*, CDK4, CDKN1B*, CDKN2A, CHEK2*, CTNNA1*, DICER1*, EPCAM (del/dup only), EGFR, FH*, FLCN*, GREM1 (promoter dup only), HOXB13, KIT, LZTR1, MAX*, MBD4, MEN1*, MET, MITF, MLH1*, MSH2*,  MSH3*, MSH6*, MUTYH*, NF1*, NF2*, NTHL1*, PALB2*, PDGFRA, PMS2*, POLD1*, POLE*, POT1*, PRKAR1A*, PTCH1*, PTEN*, RAD51C*, RAD51D*, RB1*, RET, SDHA* (sequencing only), SDHAF2*, SDHB*, SDHC*, SDHD*, SMAD4*, SMARCA4*, SMARCB1*, SMARCE1*, STK11*, SUFU*, TMEM127*, TP53*, TSC1*, TSC2*, VHL*.  RNA analysis is performed for * genes.  GENETIC TESTING NATIONAL CRITERIA: Based on Beth Fuller's personal and family history of cancer, she meets medical criteria for genetic testing. Based on her family history of pancreatic cancer (paternal uncle) and breast cancer diagnosed <50 (paternal 1st cousin) in two close relatives she meet criteria for genetic testing outlined by the [employer] (NCCN) guidelines. Despite that she meets criteria, she may still have an out of pocket cost. We discussed that if her out of pocket cost for testing is over $100, the laboratory will call and confirm whether she wants to proceed with testing.  If the out of pocket cost of testing is less than $100 she will be billed by the genetic testing laboratory.   Genetic Information Nondiscrimination Act (GINA): We discussed that some people do not want to undergo genetic testing due to fear of genetic discrimination.  The Genetic Information Nondiscrimination Act (GINA) was signed into federal law in 2008. GINA prohibits health insurers and most employers from discriminating against individuals based on genetic information (including the results of genetic tests and family history information). According to GINA, health insurance companies cannot consider genetic information to be a preexisting condition, nor can they use it to make decisions regarding coverage or rates. GINA also makes it illegal for most employers to use genetic information in making decisions about hiring, firing, promotion, or terms of employment. It is important to note that GINA does not offer protections for life insurance, disability insurance, or  long-term care insurance. GINA does not apply to those in the [employer], those who work for companies with less than 15 employees, and new life insurance or long-term disability insurance policies.  Health status due to a cancer diagnosis is not protected under GINA. More information about GINA can be found by visiting [email or web address].  Lastly, we encouraged Beth Fuller to remain in contact with cancer genetics annually so that we can continuously update the family history and inform her of any changes in cancer genetics and testing that may be of benefit for this family.   Beth Fuller questions were answered to her satisfaction today. Our contact information was provided should additional questions or concerns arise. Thank you for the referral and allowing us  to share in the care of your patient.   Resources:  Beth Fuller was provided with the following:  Gene list for the [facility] Multi Cancer Panel (sent via MyChart as she was provided with the gene list for [place] in clinic) Hereditary Cancer Testing Patient Guide Pamphlet  PLAN:  Test ordered: [facility] Multi-Cancer+RNA Panel (70 genes)  Janel Beane Lindsey-Mills, MS, CGC  Certified Genetic  Counselor  Email: [email or web address]  Phone: [phone]   In total, 35 minutes were spent on the date of the encounter in service to the patient including preparation, face-to-face consultation, documentation and care coordination.  The patient brought her daughter and ex-husband. Drs. Lanny Stalls, and/or Gudena were available for questions, if needed. _______________________________________________________________________ For Office Staff:  Number of people involved in session: 2 Was an Intern/ student involved with case: no

## 2024-08-24 NOTE — Progress Notes (Signed)
 [facility] Clinical Social Work  Initial Assessment   Beth Fuller is a 68 y.o. year old female accompanied by daughter and ex-husband. Clinical Social Work was referred by [facility] for assessment of psychosocial needs.   SDOH (Social Determinants of Health) assessments performed: Yes SDOH Interventions    Flowsheet Row Clinical Support from 08/24/2024 in [facility] Of [place]  SDOH Interventions   Food Insecurity Interventions Intervention Not Indicated  Housing Interventions Intervention Not Indicated  Transportation Interventions Intervention Not Indicated  Utilities Interventions Intervention Not Indicated    SDOH Screenings   Food Insecurity: No Food Insecurity (08/24/2024)  Housing: Low Risk  (08/24/2024)  Transportation Needs: No Transportation Needs (08/24/2024)  Utilities: Not At Risk (08/24/2024)  Depression (PHQ2-9): Low Risk  (08/24/2024)  Tobacco Use: Low Risk  (08/24/2024)    PHQ 2/9:    08/24/2024    1:08 PM  Depression screen PHQ 2/9  Decreased Interest 0  Down, Depressed, Hopeless 0  PHQ - 2 Score 0     Distress Screen completed: No     No data to display            Family/Social Information:  Housing Arrangement: patient lives alone. Her daughter lives in [place] Family members/support persons in your life? Family and Friends Transportation concerns: no  Employment: Working part time for herself as an Facilities manager. She is decreasing hours as she transitions into retirement.  Income source: Employment and Actor concerns: No Type of concern: None Food access concerns: no Religious or spiritual practice: Not known Advanced directives: Yes-not on file Services Currently in place:  Medicare & supplement  Coping/ Adjustment to diagnosis: Patient understands treatment plan and what happens next? yes Concerns about diagnosis and/or treatment: I'm not especially worried about  anything Patient reported stressors: Adjusting to my illness Patient enjoys traveling, going out to eat, walking, pilates, time with family and friends Current coping skills/ strengths: Ability for insight , Capable of independent living , Manufacturing systems engineer , Motivation for treatment/growth , Special hobby/interest , and Supportive family/friends     SUMMARY: Current SDOH Barriers:  No major barriers identified today  Clinical Social Work Clinical Goal(s):  No clinical social work goals at this time  Interventions: Discussed common feeling and emotions when being diagnosed with cancer, and the importance of support during treatment Informed patient of the support team roles and support services at [facility] Provided CSW contact information and encouraged patient to call with any questions or concerns   Follow Up Plan: Patient will contact CSW with any support or resource needs Patient verbalizes understanding of plan: Yes    Jetty Berland E Eve Rey, LCSW Clinical Social Worker [facility]

## 2024-08-30 ENCOUNTER — Other Ambulatory Visit: Payer: Self-pay | Admitting: Surgery

## 2024-08-30 DIAGNOSIS — D0512 Intraductal carcinoma in situ of left breast: Secondary | ICD-10-CM

## 2024-09-01 ENCOUNTER — Telehealth: Payer: Self-pay | Admitting: *Deleted

## 2024-09-01 ENCOUNTER — Encounter: Payer: Self-pay | Admitting: *Deleted

## 2024-09-01 NOTE — Telephone Encounter (Signed)
 Left message with patient following up on [facility] last week. Asked her to call back if any concerns/questions for navigator. She has navigator's contact info.

## 2024-09-02 ENCOUNTER — Encounter: Payer: Self-pay | Admitting: *Deleted

## 2024-09-02 DIAGNOSIS — D0512 Intraductal carcinoma in situ of left breast: Secondary | ICD-10-CM

## 2024-09-13 ENCOUNTER — Inpatient Hospital Stay
Admission: RE | Admit: 2024-09-13 | Discharge: 2024-09-13 | Disposition: A | Discharge status: Home / Self Care | Payer: Self-pay | Source: Ambulatory Visit | Attending: Radiation Oncology | Admitting: Radiation Oncology

## 2024-09-13 ENCOUNTER — Other Ambulatory Visit: Payer: Self-pay

## 2024-09-13 ENCOUNTER — Encounter (HOSPITAL_BASED_OUTPATIENT_CLINIC_OR_DEPARTMENT_OTHER): Payer: Self-pay | Admitting: Surgery

## 2024-09-13 ENCOUNTER — Other Ambulatory Visit: Payer: Self-pay | Admitting: Radiation Oncology

## 2024-09-13 DIAGNOSIS — D0512 Intraductal carcinoma in situ of left breast: Secondary | ICD-10-CM

## 2024-09-15 ENCOUNTER — Other Ambulatory Visit: Payer: Self-pay | Admitting: Surgery

## 2024-09-15 DIAGNOSIS — D0512 Intraductal carcinoma in situ of left breast: Secondary | ICD-10-CM

## 2024-09-16 ENCOUNTER — Ambulatory Visit
Admission: RE | Admit: 2024-09-16 | Discharge: 2024-09-16 | Disposition: A | Discharge status: Home / Self Care | Payer: PRIVATE HEALTH INSURANCE | Source: Ambulatory Visit | Attending: Surgery | Admitting: Surgery

## 2024-09-16 ENCOUNTER — Ambulatory Visit
Admission: RE | Admit: 2024-09-16 | Discharge: 2024-09-16 | Disposition: A | Discharge status: Home / Self Care | Source: Ambulatory Visit | Attending: Surgery | Admitting: Surgery

## 2024-09-16 ENCOUNTER — Encounter (HOSPITAL_BASED_OUTPATIENT_CLINIC_OR_DEPARTMENT_OTHER)
Admission: RE | Admit: 2024-09-16 | Discharge: 2024-09-16 | Disposition: A | Discharge status: Home / Self Care | Source: Ambulatory Visit | Attending: Surgery | Admitting: Surgery

## 2024-09-16 DIAGNOSIS — D0512 Intraductal carcinoma in situ of left breast: Secondary | ICD-10-CM

## 2024-09-16 DIAGNOSIS — R9431 Abnormal electrocardiogram [ECG] [EKG]: Secondary | ICD-10-CM | POA: Insufficient documentation

## 2024-09-16 DIAGNOSIS — Z01818 Encounter for other preprocedural examination: Secondary | ICD-10-CM | POA: Diagnosis present

## 2024-09-16 DIAGNOSIS — Z01810 Encounter for preprocedural cardiovascular examination: Secondary | ICD-10-CM | POA: Diagnosis not present

## 2024-09-16 HISTORY — PX: BREAST BIOPSY: SHX20

## 2024-09-16 NOTE — Progress Notes (Signed)

## 2024-09-20 ENCOUNTER — Other Ambulatory Visit: Payer: Self-pay

## 2024-09-20 ENCOUNTER — Ambulatory Visit
Admission: RE | Admit: 2024-09-20 | Discharge: 2024-09-20 | Disposition: A | Payer: PRIVATE HEALTH INSURANCE | Source: Ambulatory Visit | Attending: Surgery | Admitting: Surgery

## 2024-09-20 ENCOUNTER — Ambulatory Visit (HOSPITAL_BASED_OUTPATIENT_CLINIC_OR_DEPARTMENT_OTHER): Admitting: Certified Registered"

## 2024-09-20 ENCOUNTER — Encounter (HOSPITAL_BASED_OUTPATIENT_CLINIC_OR_DEPARTMENT_OTHER): Payer: Self-pay | Admitting: Surgery

## 2024-09-20 ENCOUNTER — Encounter (HOSPITAL_BASED_OUTPATIENT_CLINIC_OR_DEPARTMENT_OTHER)
Admission: RE | Disposition: A | Discharge status: Home / Self Care | Payer: Self-pay | Source: Home / Self Care | Attending: Surgery

## 2024-09-20 ENCOUNTER — Ambulatory Visit (HOSPITAL_BASED_OUTPATIENT_CLINIC_OR_DEPARTMENT_OTHER)
Admission: RE | Admit: 2024-09-20 | Discharge: 2024-09-20 | Disposition: A | Discharge status: Home / Self Care | Payer: PRIVATE HEALTH INSURANCE | Attending: Surgery | Admitting: Surgery

## 2024-09-20 ENCOUNTER — Encounter: Payer: Self-pay | Admitting: *Deleted

## 2024-09-20 DIAGNOSIS — K219 Gastro-esophageal reflux disease without esophagitis: Secondary | ICD-10-CM | POA: Diagnosis not present

## 2024-09-20 DIAGNOSIS — E039 Hypothyroidism, unspecified: Secondary | ICD-10-CM | POA: Diagnosis not present

## 2024-09-20 DIAGNOSIS — D242 Benign neoplasm of left breast: Secondary | ICD-10-CM | POA: Diagnosis not present

## 2024-09-20 DIAGNOSIS — I10 Essential (primary) hypertension: Secondary | ICD-10-CM | POA: Insufficient documentation

## 2024-09-20 DIAGNOSIS — D0512 Intraductal carcinoma in situ of left breast: Secondary | ICD-10-CM | POA: Insufficient documentation

## 2024-09-20 DIAGNOSIS — G4730 Sleep apnea, unspecified: Secondary | ICD-10-CM | POA: Diagnosis not present

## 2024-09-20 DIAGNOSIS — Z01818 Encounter for other preprocedural examination: Secondary | ICD-10-CM

## 2024-09-20 HISTORY — PX: BREAST LUMPECTOMY WITH RADIOACTIVE SEED LOCALIZATION: SHX6424

## 2024-09-20 HISTORY — DX: Gastro-esophageal reflux disease without esophagitis: K21.9

## 2024-09-20 HISTORY — DX: Sleep apnea, unspecified: G47.30

## 2024-09-20 SURGERY — BREAST LUMPECTOMY WITH RADIOACTIVE SEED LOCALIZATION
Anesthesia: General | Site: Breast | Laterality: Left

## 2024-09-20 MED ORDER — DEXAMETHASONE SOD PHOSPHATE PF 10 MG/ML IJ SOLN
INTRAMUSCULAR | Status: DC | PRN
  Administered 2024-09-20: 10 mg via INTRAVENOUS

## 2024-09-20 MED ORDER — PROPOFOL 10 MG/ML IV BOLUS
INTRAVENOUS | Status: AC
  Filled 2024-09-20: qty 20

## 2024-09-20 MED ORDER — LIDOCAINE HCL (CARDIAC) PF 100 MG/5ML IV SOSY
PREFILLED_SYRINGE | INTRAVENOUS | Status: DC | PRN
  Administered 2024-09-20: 20 mg via INTRAVENOUS

## 2024-09-20 MED ORDER — PROPOFOL 10 MG/ML IV BOLUS
INTRAVENOUS | Status: DC | PRN
  Administered 2024-09-20: 170 mg via INTRAVENOUS

## 2024-09-20 MED ORDER — MIDAZOLAM HCL 2 MG/2ML IJ SOLN
INTRAMUSCULAR | Status: AC
  Filled 2024-09-20: qty 2

## 2024-09-20 MED ORDER — EPHEDRINE 5 MG/ML INJ
INTRAVENOUS | Status: AC
  Filled 2024-09-20: qty 5

## 2024-09-20 MED ORDER — CEFAZOLIN SODIUM-DEXTROSE 2-4 GM/100ML-% IV SOLN
INTRAVENOUS | Status: AC
  Filled 2024-09-20: qty 100

## 2024-09-20 MED ORDER — MIDAZOLAM HCL 5 MG/5ML IJ SOLN
INTRAMUSCULAR | Status: DC | PRN
Start: 2024-09-20 — End: 2024-09-20
  Administered 2024-09-20: 2 mg via INTRAVENOUS

## 2024-09-20 MED ORDER — CEFAZOLIN SODIUM-DEXTROSE 2-4 GM/100ML-% IV SOLN
2.0000 g | INTRAVENOUS | Status: AC
  Administered 2024-09-20: 2 g via INTRAVENOUS

## 2024-09-20 MED ORDER — LACTATED RINGERS IV SOLN
INTRAVENOUS | Status: DC
Start: 2024-09-20 — End: 2024-09-20

## 2024-09-20 MED ORDER — EPHEDRINE 5 MG/ML INJ
INTRAVENOUS | Status: AC
Start: 2024-09-20 — End: 2024-09-20
  Filled 2024-09-20: qty 5

## 2024-09-20 MED ORDER — OXYCODONE HCL 5 MG PO TABS: 5.0000 mg | ORAL_TABLET | Freq: Four times a day (QID) | ORAL | 0 refills | Status: AC | PRN

## 2024-09-20 MED ORDER — OXYCODONE HCL 5 MG/5ML PO SOLN: 5.0000 mg | Freq: Once | ORAL | Status: DC | PRN

## 2024-09-20 MED ORDER — 0.9 % SODIUM CHLORIDE (POUR BTL) OPTIME
TOPICAL | Status: DC | PRN
  Administered 2024-09-20: 250 mL

## 2024-09-20 MED ORDER — FENTANYL CITRATE (PF) 100 MCG/2ML IJ SOLN
INTRAMUSCULAR | Status: AC
  Filled 2024-09-20: qty 2

## 2024-09-20 MED ORDER — CHLORHEXIDINE GLUCONATE CLOTH 2 % EX PADS: 6.0000 | MEDICATED_PAD | Freq: Once | CUTANEOUS | Status: DC

## 2024-09-20 MED ORDER — ONDANSETRON HCL 4 MG/2ML IJ SOLN
INTRAMUSCULAR | Status: DC | PRN
  Administered 2024-09-20: 4 mg via INTRAVENOUS

## 2024-09-20 MED ORDER — PHENYLEPHRINE HCL-NACL 20-0.9 MG/250ML-% IV SOLN
INTRAVENOUS | Status: DC | PRN
  Administered 2024-09-20: 20 ug/min via INTRAVENOUS

## 2024-09-20 MED ORDER — EPHEDRINE SULFATE (PRESSORS) 25 MG/5ML IV SOSY
PREFILLED_SYRINGE | INTRAVENOUS | Status: DC | PRN
  Administered 2024-09-20 (×3): 10 mg via INTRAVENOUS
  Administered 2024-09-20 (×2): 5 mg via INTRAVENOUS

## 2024-09-20 MED ORDER — AMISULPRIDE (ANTIEMETIC) 5 MG/2ML IV SOLN: 10.0000 mg | Freq: Once | INTRAVENOUS | Status: DC | PRN

## 2024-09-20 MED ORDER — OXYCODONE HCL 5 MG PO TABS: 5.0000 mg | ORAL_TABLET | Freq: Once | ORAL | Status: DC | PRN

## 2024-09-20 MED ORDER — FENTANYL CITRATE (PF) 100 MCG/2ML IJ SOLN
25.0000 ug | INTRAMUSCULAR | Status: DC | PRN
  Administered 2024-09-20: 50 ug via INTRAVENOUS

## 2024-09-20 MED ORDER — FENTANYL CITRATE (PF) 100 MCG/2ML IJ SOLN
INTRAMUSCULAR | Status: DC | PRN
  Administered 2024-09-20: 25 ug via INTRAVENOUS
  Administered 2024-09-20: 50 ug via INTRAVENOUS
  Administered 2024-09-20: 25 ug via INTRAVENOUS

## 2024-09-20 MED ORDER — BUPIVACAINE-EPINEPHRINE (PF) 0.25% -1:200000 IJ SOLN
INTRAMUSCULAR | Status: DC | PRN
  Administered 2024-09-20: 20 mL

## 2024-09-20 MED ORDER — IBUPROFEN 800 MG PO TABS: 800.0000 mg | ORAL_TABLET | Freq: Three times a day (TID) | ORAL | 0 refills | Status: AC | PRN

## 2024-09-20 SURGICAL SUPPLY — 41 items
BINDER BREAST LRG (GAUZE/BANDAGES/DRESSINGS) IMPLANT
BINDER BREAST MEDIUM (GAUZE/BANDAGES/DRESSINGS) IMPLANT
BINDER BREAST XLRG (GAUZE/BANDAGES/DRESSINGS) IMPLANT
BINDER BREAST XXLRG (GAUZE/BANDAGES/DRESSINGS) IMPLANT
BLADE SURG 15 STRL LF DISP TIS (BLADE) ×1 IMPLANT
CANISTER SUC SOCK COL 7IN (MISCELLANEOUS) IMPLANT
CANISTER SUCT 1200ML W/VALVE (MISCELLANEOUS) IMPLANT
CHLORAPREP W/TINT 26 (MISCELLANEOUS) ×1 IMPLANT
CLIP APPLIE 9.375 MED OPEN (MISCELLANEOUS) IMPLANT
COVER BACK TABLE 60X90IN (DRAPES) ×1 IMPLANT
COVER MAYO STAND STRL (DRAPES) ×1 IMPLANT
COVER PROBE CYLINDRICAL 5X96 (MISCELLANEOUS) ×1 IMPLANT
DERMABOND ADVANCED .7 DNX12 (GAUZE/BANDAGES/DRESSINGS) ×1 IMPLANT
DRAPE LAPAROSCOPIC ABDOMINAL (DRAPES) IMPLANT
DRAPE LAPAROTOMY 100X72 PEDS (DRAPES) ×1 IMPLANT
DRAPE UTILITY XL STRL (DRAPES) ×1 IMPLANT
ELECT COATED BLADE 2.86 ST (ELECTRODE) ×1 IMPLANT
ELECTRODE REM PT RTRN 9FT ADLT (ELECTROSURGICAL) ×1 IMPLANT
GLOVE BIOGEL PI IND STRL 8 (GLOVE) ×1 IMPLANT
GLOVE ECLIPSE 8.0 STRL XLNG CF (GLOVE) ×1 IMPLANT
GOWN STRL REUS W/ TWL LRG LVL3 (GOWN DISPOSABLE) ×2 IMPLANT
GOWN STRL REUS W/ TWL XL LVL3 (GOWN DISPOSABLE) ×1 IMPLANT
HEMOSTAT ARISTA ABSORB 3G PWDR (HEMOSTASIS) IMPLANT
HEMOSTAT SNOW SURGICEL 2X4 (HEMOSTASIS) IMPLANT
KIT MARKER MARGIN INK (KITS) ×1 IMPLANT
NDL HYPO 25X1 1.5 SAFETY (NEEDLE) ×1 IMPLANT
NEEDLE HYPO 25X1 1.5 SAFETY (NEEDLE) ×1 IMPLANT
NS IRRIG 1000ML POUR BTL (IV SOLUTION) ×1 IMPLANT
PACK BASIN DAY SURGERY FS (CUSTOM PROCEDURE TRAY) ×1 IMPLANT
PENCIL SMOKE EVACUATOR (MISCELLANEOUS) ×1 IMPLANT
SLEEVE SCD COMPRESS KNEE MED (STOCKING) ×1 IMPLANT
SPIKE FLUID TRANSFER (MISCELLANEOUS) IMPLANT
SPONGE T-LAP 4X18 ~~LOC~~+RFID (SPONGE) ×1 IMPLANT
SUT MNCRL AB 4-0 PS2 18 (SUTURE) ×1 IMPLANT
SUT SILK 2 0 SH (SUTURE) IMPLANT
SUT VICRYL 3-0 CR8 SH (SUTURE) ×1 IMPLANT
SYR CONTROL 10ML LL (SYRINGE) ×1 IMPLANT
TOWEL GREEN STERILE FF (TOWEL DISPOSABLE) ×1 IMPLANT
TRAY FAXITRON CT DISP (TRAY / TRAY PROCEDURE) ×1 IMPLANT
TUBE CONNECTING 20X1/4 (TUBING) IMPLANT
YANKAUER SUCT BULB TIP NO VENT (SUCTIONS) IMPLANT

## 2024-09-20 NOTE — H&P (Signed)
 Chief Complaint: Breast Cancer  History of Present Illness: Beth Fuller is a 68 y.o. female who is seen today as an office consultation for evaluation of Breast Cancer  The patient presents to the [facility] for evaluation of abnormal left breast mammogram on recent screening. Core biopsy showed an area of DCIS left breast upper outer quadrant involving 5.3 cm in maximal diameter. 2 biopsies performed the anterior and posterior extent. No invasive disease is identified. Patient denies any complaints of breast pain breast mass or nipple discharge. Remote history of breast cancer noted by the patient.   Review of Systems: A complete review of systems was obtained from the patient. I have reviewed this information and discussed as appropriate with the patient. See HPI as well for other ROS.    Medical History: Past Medical History:  Diagnosis Date  Arrhythmia  GERD (gastroesophageal reflux disease)  Heart valve disease  Hypertension  Sleep apnea  Thyroid  disease   There is no problem list on file for this patient.  Past Surgical History:  Procedure Laterality Date  LAPAROSCOPIC TUBAL LIGATION  THYROIDECTOMY FOR REMOVAL REMAINING TISSUE    No Known Allergies  Current Outpatient Medications on File Prior to Visit  Medication Sig Dispense Refill  clonazePAM (KLONOPIN) 0.5 MG tablet Take 0.5 mg by mouth at bedtime as needed  esomeprazole (NEXIUM) 40 MG DR capsule Take 40 mg by mouth  gabapentin (NEURONTIN) 300 MG capsule Take 300 mg by mouth 3 (three) times daily  hydroCHLOROthiazide (MICROZIDE) 12.5 mg capsule Take 12.5 mg by mouth once daily  imipramine (TOFRANIL) 50 MG tablet Take 50 mg by mouth at bedtime  metoprolol TARTrate (LOPRESSOR) 50 MG tablet Take 50 mg by mouth 2 (two) times daily  multivitamin tablet Take 1 tablet by mouth  SYNTHROID  88 mcg tablet TAKE 1 TABLET BY MOUTH DAILY IN THE MORNING ON AN EMPTY STOMACH   No current facility-administered medications on file prior  to visit.   Family History  Problem Relation Age of Onset  High blood pressure (Hypertension) Mother  High blood pressure (Hypertension) Father  Hyperlipidemia (Elevated cholesterol) Sister  Hyperlipidemia (Elevated cholesterol) Brother    Social History   Tobacco Use  Smoking Status Never  Smokeless Tobacco Never    Social History   Socioeconomic History  Marital status: Married  Tobacco Use  Smoking status: Never  Smokeless tobacco: Never  Substance and Sexual Activity  Alcohol use: Yes  Alcohol/week: 4.0 - 14.0 standard drinks of alcohol  Types: 4 - 14 Standard drinks or equivalent per week  Drug use: Never   Social Drivers of Health   Housing Stability: Unknown (08/24/2024)  Housing Stability Vital Sign  Homeless in the Last Year: No   Objective:  There were no vitals filed for this visit.  There is no height or weight on file to calculate BMI.  Physical Exam Exam conducted with a chaperone present.  HENT:  Head: Normocephalic.  Cardiovascular:  Rate and Rhythm: Normal rate.  Pulmonary:  Effort: Pulmonary effort is normal.  Chest:  Breasts: Right: Normal. No mass or nipple discharge.  Left: No mass or nipple discharge.   Comments: Bruising left breast upper outer quadrant Musculoskeletal:  General: Normal range of motion.  Cervical back: Normal range of motion.  Lymphadenopathy:  Upper Body:  Right upper body: No supraclavicular or axillary adenopathy.  Left upper body: No supraclavicular or axillary adenopathy.  Skin: General: Skin is warm.  Neurological:  General: No focal deficit present.  Mental Status: She  is alert.  Psychiatric:  Mood and Affect: Mood normal.     Labs, Imaging and Diagnostic Testing:  CLINICAL DATA: 68 year old woman with segmentally distributed LEFT  breast upper-outer quadrant calcifications, spanning 5.3 cm,  presents for stereotactic guided biopsy of the anterior and  posterior extents.   EXAM:  LEFT BREAST  STEREOTACTIC CORE NEEDLE BIOPSY   COMPARISON: Previous exam(s).   FINDINGS:  The patient and I discussed the procedure of stereotactic-guided  biopsy including benefits and alternatives. We discussed the high  likelihood of a successful procedure. We discussed the risks of the  procedure including infection, bleeding, tissue injury, clip  migration, and inadequate sampling. Informed written consent was  given. The usual time out protocol was performed immediately prior  to the procedure.   SITE 1: ANTERIOR EXTENT OF LEFT UPPER OUTER QUADRANT BREAST  CALCIFICATION   Using sterile technique and 1% Lidocaine as local anesthetic, under  stereotactic guidance, a 9 gauge vacuum assisted device was used to  perform core needle biopsy of calcifications in the upper outer  quadrant of the left breast using a lateral approach. Specimen  radiograph was performed showing calcifications. Specimens with  calcifications are identified for pathology.   Lesion quadrant: Upper outer quadrant   At the conclusion of the procedure, X shaped tissue marker clip was  deployed into the biopsy cavity.   SITE 2: SUPERIOR POSTERIOR EXTENT OF LEFT UPPER OUTER QUADRANT  BREAST CALCIFICATIONS   Using sterile technique and 1% Lidocaine as local anesthetic, under  stereotactic guidance, a 9 gauge vacuum assisted device was used to  perform core needle biopsy of calcifications in the upper outer  quadrant of the left breast using a lateral approach. Specimen  radiograph was performed showing calcifications. Specimens with  calcifications are identified for pathology.   Lesion quadrant: Upper outer quadrant   At the conclusion of the procedure, ribbon shaped tissue marker clip  was deployed into the biopsy cavity.   Follow-up 2-view mammogram was performed and dictated separately.   IMPRESSION:  Stereotactic-guided biopsy of the anterior and superior posterior  extends of calcifications in the  upper-outer quadrant of the LEFT  breast. No apparent complications.   Electronically Signed:  By: Aliene Lloyd M.D.  On: 08/11/2024 10:02   FINAL DIAGNOSIS   1. Breast, left, needle core biopsy, calcifications, upper outer quadrant, anterior extent, clip x :  DUCTAL CARCINOMA IN SITU, LOW TO INTERMEDIATE GRADE, CRIBRIFORM TYPE  NECROSIS: NOT PRESENT  CALCIFICATIONS: NOR PRESENT  DCIS LENGTH: 0.2 CM / 2 MM  SEE NOTE   2. Breast, left, needle core biopsy, calcifications, upper outer quadrant, superior posterior extent, clip ribbon :  DUCTAL CARCINOMA IN SITU, LOW TO INTERMEDIATE GRADE, CRIBRIFORM TYPE  NECROSIS: PRESENT  CALCIFICATIONS: PRESENT  DCIS LENGTH: 1.3 CM / 13 MM  SEE NOTE   Diagnosis Note : Immunohistochemical stains were performed on 1/A.CK5/6 shows  loss of expression in the luminal proliferation and ER is positive.The findings  are supportive of the diagnosis of ductal carcinoma in situ.Dr.Patrick reviewed  the case and concurs with the interpretation.A breast prognostic profile (ER and  PR) is pending and will be reported in an addendum.The breast center of  [facility] was notified on 08/15/2024.  Diagnosis Note : Dr.Patrick reviewed the case and concurs with the  interpretation.A breast prognostic profile (ER and PR) is pending and will be  reported in an addendum.The breast center of [facility] was notified on  08/12/2024.   ELECTRONIC SIGNATURE :  Pepper Dutton Md, Pathologist, Electronic Signature   MICROSCOPIC DESCRIPTION   CASE COMMENTS  STAINS USED IN DIAGNOSIS:  H&E-2  H&E-3  H&E-4  H&E  CK 5/6  ER - NOACIS  Universal Negative Control-DAB  H&E-2  H&E-3  H&E-4  H&E  *RECUT 1 SLIDE  Stains used in diagnosis 2 ER-ACIS, 2 PR-ACIS  Estrogen receptor (6F11), immunohistochemical stains are performed on formalin  fixed, paraffin embedded tissue using a 3,3-diaminobenzidine (DAB) chromogen  and Leica Bond Autostainer System. The staining  intensity of the nucleus is  scored manually and is reported as the percentage of tumor cell nuclei  demonstrating specific nuclear staining.Specimens are fixed in 10% Neutral  Buffered Formalin for at least 6 hours and up to 72 hours. These tests have not  be validated on decalcified tissue. Results should be interpreted with caution  given the possibility of false negative results on decalcified specimens.  PR progesterone receptor (16), immunohistochemical stains are performed on  formalin fixed, paraffin embedded tissue using a 3,3-diaminobenzidine (DAB)  chromogen and Leica Bond Autostainer System. The staining intensity of the  nucleus is scored manually and is reported as the percentage of tumor cell  nuclei demonstrating specific nuclear staining.Specimens are fixed in 10%  Neutral Buffered Formalin for at least 6 hours and up to 72 hours. These tests  have not be validated on decalcified tissue. Results should be interpreted with  caution given the possibility of false negative results on decalcified  specimens.   ADDENDUM  2A) Breast, left, needle core biopsy, calcifications, upper outer quadrant, superior posterior extent, clip ribbon  PROGNOSTIC INDICATORS   Results:  IMMUNOHISTOCHEMICAL AND MORPHOMETRIC ANALYSIS PERFORMED MANUALLY  Estrogen Receptor: 100%, POSITIVE, STRONG STAINING INTENSITY  Progesterone Receptor: 30%, POSITIVE, STRONG STAINING INTENSITY  REFERENCE RANGE ESTROGEN RECEPTOR  NEGATIVE 0%  POSITIVE =>1%  REFERENCE RANGE PROGESTERONE RECEPTOR  NEGATIVE 0%  POSITIVE =>1%  All controls stained appropriately  [place]les[place] Md, Fred , Sports Administrator, International Aid/development Worker  ( Signed 09 22 2025)   CLINICAL HISTORY   SPECIMEN(S) OBTAINED  1. Breast, left, needle core biopsy, Calcifications, Upper Outer Quadrant,  Anterior Extent, Clip X  2. Breast, left, needle core biopsy, Calcifications, Upper Outer Quadrant,  Superior Posterior Extent, Clip Ribbon   SPECIMEN  COMMENTS:  1. TIF: 8:20, CIT: < 5 min  2. TIF: 8:45, CIT: < 5 min  SPECIMEN CLINICAL INFORMATION:  47. 68 year old woman with left upper outer breast calcifications spanning 5.3 cm  presents for 2 site biopsy   Gross Description  1. Received in formalin labeled with the patient's name Sarabeth Benton) and Lt  breast anterior is a 1.2 x 1.2 x 0.2 cm aggregate of yellow-tan fibrofatty  tissue, submitted in toto in a single cassette(s).TIF 08:20, CIT <5 minutes (X  clip).(LEF, 08/11/2024)  2. Received in formalin labeled with the patient's name Joshlyn Beadle) and Lt  breast superior posterior is a 1.7 x 1.6 x 0.2 cm aggregate of yellow-tan  fibrofatty tissue, submitted in toto in a single cassette(s).TIF 08:45, CIT <5  minutes (ribbon clip).(LEF, 08/11/2024)   Report signed out from the following location(s)  [facility]  [address] [place], [place] [address] CLIA #: [id number]   Assessment and Plan:   Diagnoses and all orders for this visit:  Ductal carcinoma in situ (DCIS) of left breast   Discussed breast conserving surgery versus mastectomy with reconstruction. Long-term survival, local regional recurrence and quality of life all reviewed. The use of  radiation therapy reviewed. She appears to be a good candidate for a bracketed left breast seed localized lumpectomy due to upper outer quadrant location of disease. Discussed the potential cosmetic impact. Contrasted that with mastectomy with reconstruction. Discussed the role of sentinel lymph node mapping and mag trace can be injected at the time of surgery if need be. This can allow us  to delay sentinel lymph node mapping if necessary. Patient agrees to proceed with the above breast conserving plan.The procedure has been discussed with the patient. Alternatives to surgery have been discussed with the patient. Risks of surgery include bleeding, Infection, Seroma formation, death, and the need for further surgery.  The patient understands and wishes to proceed.   DEBBY CURTISTINE SHIPPER, MD

## 2024-09-20 NOTE — Op Note (Signed)
 Preoperative diagnosis: Left breast DCIS upper outer quadrant multicentric  Postoperative diagnosis: Same  Procedure: Left breast seed localized lumpectomy utilizing bracketed approach  Surgeon: Debby Shipper, MD  Anesthesia: LMA by 0.25% Marcaine with epinephrine  EBL: 10 cc  Specimen: Left breast tissue with 2 biopsy clips and 3 localizing seeds verified by Faxitron.  Indications for procedure: The patient is a 68 year old female with left breast DCIS.  She presents for breast conserving surgery utilizing a bracketed approach for localization.  Risks and benefits reviewed.  She was seen in the [facility] and all surgical options were reviewed as well regarding breast conserving surgery versus mastectomy with or without reconstruction.The procedure has been discussed with the patient. Alternatives to surgery have been discussed with the patient.  Risks of surgery include bleeding,  Infection,  Seroma formation, death,  cosmesis,  and the need for further surgery.   The patient understands and wishes to proceed.   Description of procedure: The patient was met in the holding area all questions were answered.  Of note she underwent seed localization as an outpatient by the radiologist.  Films were available for review.  Left breast was marked as correct site.  She was then taken back to the operating room placed upon upon the operating table.  After induction of general anesthesia, left breast was prepped and draped in sterile fashion timeout performed.  Films were available for review in the OR.  Neoprobe was used to identify all 3 seeds.  A curvilinear incision was made left breast upper outer quadrant.  Dissection was carried down to the subcutaneous tissues into the breast tissue.  All tissue around all 3 seeds was excised with a grossly negative margin.  This was oriented with ink.  Imaging revealed the 3 seeds to be present and 2 clips.  The third seed was placed in calcifications to mark the posterior  border of the area.  There is no biopsy clip there since she did not have the area biopsied.  All gross disease was removed.  Irrigation was used.  Hemostasis achieved with cautery.  Clips used to mark the cavity.  The breast was reapproximated with a deep layer 3-0 Vicryl closed in the deep space.  4 Monocryl was used to close the skin in a subcuticular fashion.  Dermabond applied.  Breast binder placed.  All counts found to be correct.  The patient was awoke extubated taken to recovery in satisfactory condition.

## 2024-09-20 NOTE — Progress Notes (Signed)
 Patient left navigator voice mail yesterday asking if she needed to have any cardiology f/u after her pre op EKG. Navigator sent Dr. Vanderbilt an inbasket message (received) to make him aware she wanted to ask him in the am (surgery today). This navigator left patient message on voice mail this am stating I passed her question on to Dr. Vanderbilt.

## 2024-09-20 NOTE — Interval H&P Note (Signed)
 History and Physical Interval Note:  09/20/2024 8:31 AM  Beth Fuller  has presented today for surgery, with the diagnosis of DUCTAL CARCINOMA IN SITU OF LEFT BREAST.  The various methods of treatment have been discussed with the patient and family. After consideration of risks, benefits and other options for treatment, the patient has consented to  Procedure(s) with comments: BREAST LUMPECTOMY WITH RADIOACTIVE SEED LOCALIZATION (Left) - BRACKETED LEFT BREAST LUMPECTOMY as a surgical intervention.  The patient's history has been reviewed, patient examined, no change in status, stable for surgery.  I have reviewed the patient's chart and labs.  Questions were answered to the patient's satisfaction.   The procedure has been discussed with the patient. Alternatives to surgery have been discussed with the patient.  Risks of surgery include bleeding,  Infection,  Seroma formation, death,  and the need for further surgery.   The patient understands and wishes to proceed.   Raksha Wolfgang A Carrianne Hyun

## 2024-09-20 NOTE — Anesthesia Procedure Notes (Signed)
 Procedure Name: LMA Insertion Date/Time: 09/20/2024 9:09 AM  Performed by: Debarah Chiquita LABOR, CRNAPre-anesthesia Checklist: Patient identified, Emergency Drugs available, Suction available and Patient being monitored Patient Re-evaluated:Patient Re-evaluated prior to induction Oxygen Delivery Method: Circle system utilized Preoxygenation: Pre-oxygenation with 100% oxygen Induction Type: IV induction Ventilation: Mask ventilation without difficulty LMA: LMA inserted LMA Size: 4.0 Number of attempts: 1 Airway Equipment and Method: Bite block Placement Confirmation: positive ETCO2 Tube secured with: Tape Dental Injury: Teeth and Oropharynx as per pre-operative assessment

## 2024-09-20 NOTE — Anesthesia Preprocedure Evaluation (Signed)
 Anesthesia Evaluation  Patient identified by MRN, date of birth, ID band Patient awake    Reviewed: Allergy & Precautions, NPO status , Patient's Chart, lab work & pertinent test results  Airway Mallampati: II  TM Distance: >3 FB Neck ROM: Full    Dental  (+) Dental Advisory Given   Pulmonary sleep apnea    breath sounds clear to auscultation       Cardiovascular hypertension, Pt. on medications and Pt. on home beta blockers  Rhythm:Regular Rate:Normal     Neuro/Psych negative neurological ROS     GI/Hepatic Neg liver ROS,GERD  ,,  Endo/Other  Hypothyroidism    Renal/GU negative Renal ROS     Musculoskeletal   Abdominal   Peds  Hematology negative hematology ROS (+)   Anesthesia Other Findings   Reproductive/Obstetrics                              Anesthesia Physical Anesthesia Plan  ASA: 2  Anesthesia Plan: General   Post-op Pain Management: Tylenol PO (pre-op)* and Toradol IV (intra-op)*   Induction: Intravenous  PONV Risk Score and Plan: 3 and Dexamethasone, Ondansetron, Midazolam and Treatment may vary due to age or medical condition  Airway Management Planned: Oral ETT  Additional Equipment: None  Intra-op Plan:   Post-operative Plan: Extubation in OR  Informed Consent: I have reviewed the patients History and Physical, chart, labs and discussed the procedure including the risks, benefits and alternatives for the proposed anesthesia with the patient or authorized representative who has indicated his/her understanding and acceptance.     Dental advisory given  Plan Discussed with:   Anesthesia Plan Comments:         Anesthesia Quick Evaluation

## 2024-09-20 NOTE — Transfer of Care (Signed)
 Immediate Anesthesia Transfer of Care Note  Patient: Beth Fuller  Procedure(s) Performed: BREAST LUMPECTOMY WITH RADIOACTIVE SEED LOCALIZATION (Left: Breast)  Patient Location: PACU  Anesthesia Type:General  Level of Consciousness: awake, alert , and oriented  Airway & Oxygen Therapy: Patient Spontanous Breathing and Patient connected to face mask oxygen  Post-op Assessment: Report given to RN and Post -op Vital signs reviewed and stable  Post vital signs: Reviewed and stable  Last Vitals:  Vitals Value Taken Time  BP 134/73 09/20/24 10:18  Temp    Pulse 75 09/20/24 10:18  Resp 15 09/20/24 10:18  SpO2 100 % 09/20/24 10:18  Vitals shown include unfiled device data.  Last Pain:  Vitals:   09/20/24 0811  TempSrc: Temporal  PainSc: 0-No pain      Patients Stated Pain Goal: 3 (09/20/24 9188)  Complications: No notable events documented.

## 2024-09-20 NOTE — Discharge Instructions (Addendum)
[facility] Office Phone Number [phone]  BREAST BIOPSY/ PARTIAL MASTECTOMY: POST OP INSTRUCTIONS  Always review your discharge instruction sheet given to you by the facility where your surgery was performed.  IF YOU HAVE DISABILITY OR FAMILY LEAVE FORMS, YOU MUST BRING THEM TO THE OFFICE FOR PROCESSING.  DO NOT GIVE THEM TO YOUR DOCTOR.  A prescription for pain medication may be given to you upon discharge.  Take your pain medication as prescribed, if needed.  If narcotic pain medicine is not needed, then you may take acetaminophen (Tylenol) or ibuprofen (Advil) as needed. Take your usually prescribed medications unless otherwise directed If you need a refill on your pain medication, please contact your pharmacy.  They will contact our office to request authorization.  Prescriptions will not be filled after 5pm or on week-ends. You should eat very light the first 24 hours after surgery, such as soup, crackers, pudding, etc.  Resume your normal diet the day after surgery. Most patients will experience some swelling and bruising in the breast.  Ice packs and a good support bra will help.  Swelling and bruising can take several days to resolve.  It is common to experience some constipation if taking pain medication after surgery.  Increasing fluid intake and taking a stool softener will usually help or prevent this problem from occurring.  A mild laxative (Milk of Magnesia or Miralax) should be taken according to package directions if there are no bowel movements after 48 hours. Unless discharge instructions indicate otherwise, you may remove your bandages 24-48 hours after surgery, and you may shower at that time.  You may have steri-strips (small skin tapes) in place directly over the incision.  These strips should be left on the skin for 7-10 days.  If your surgeon used skin glue on the incision, you may shower in 24 hours.  The glue will flake off over the next 2-3 weeks.  Any  sutures or staples will be removed at the office during your follow-up visit. ACTIVITIES:  You may resume regular daily activities (gradually increasing) beginning the next day.  Wearing a good support bra or sports bra minimizes pain and swelling.  You may have sexual intercourse when it is comfortable. You may drive when you no longer are taking prescription pain medication, you can comfortably wear a seatbelt, and you can safely maneuver your car and apply brakes. RETURN TO WORK:  ______________________________________________________________________________________ You should see your doctor in the office for a follow-up appointment approximately two weeks after your surgery.  Your doctor's nurse will typically make your follow-up appointment when she calls you with your pathology report.  Expect your pathology report 2-3 business days after your surgery.  You may call to check if you do not hear from us after three days. OTHER INSTRUCTIONS: _______________________________________________________________________________________________ _____________________________________________________________________________________________________________________________________ _____________________________________________________________________________________________________________________________________ _____________________________________________________________________________________________________________________________________  WHEN TO CALL YOUR DOCTOR: Fever over 101.0 Nausea and/or vomiting. Extreme swelling or bruising. Continued bleeding from incision. Increased pain, redness, or drainage from the incision.  The clinic staff is available to answer your questions during regular business hours.  Please don't hesitate to call and ask to speak to one of the nurses for clinical concerns.  If you have a medical emergency, go to the nearest emergency room or call 911.  A surgeon from [facility] is always on call at the hospital.  For further questions, please visit [email or web address]    Post Anesthesia Home Care Instructions  Activity: Get plenty of rest for the remainder of   the day. A responsible individual must stay with you for 24 hours following the procedure.  For the next 24 hours, DO NOT: -Drive a car -Operate machinery -Drink alcoholic beverages -Take any medication unless instructed by your physician -Make any legal decisions or sign important papers.  Meals: Start with liquid foods such as gelatin or soup. Progress to regular foods as tolerated. Avoid greasy, spicy, heavy foods. If nausea and/or vomiting occur, drink only clear liquids until the nausea and/or vomiting subsides. Call your physician if vomiting continues.  Special Instructions/Symptoms: Your throat may feel dry or sore from the anesthesia or the breathing tube placed in your throat during surgery. If this causes discomfort, gargle with warm salt water. The discomfort should disappear within 24 hours.  If you had a scopolamine patch placed behind your ear for the management of post- operative nausea and/or vomiting:  1. The medication in the patch is effective for 72 hours, after which it should be removed.  Wrap patch in a tissue and discard in the trash. Wash hands thoroughly with soap and water. 2. You may remove the patch earlier than 72 hours if you experience unpleasant side effects which may include dry mouth, dizziness or visual disturbances. 3. Avoid touching the patch. Wash your hands with soap and water after contact with the patch.      

## 2024-09-20 NOTE — Anesthesia Postprocedure Evaluation (Signed)
 Anesthesia Post Note  Patient: Beth Fuller  Procedure(s) Performed: BREAST LUMPECTOMY WITH RADIOACTIVE SEED LOCALIZATION (Left: Breast)     Patient location during evaluation: PACU Anesthesia Type: General Level of consciousness: awake and alert Pain management: pain level controlled Vital Signs Assessment: post-procedure vital signs reviewed and stable Respiratory status: spontaneous breathing, nonlabored ventilation, respiratory function stable and patient connected to nasal cannula oxygen Cardiovascular status: blood pressure returned to baseline and stable Postop Assessment: no apparent nausea or vomiting Anesthetic complications: no   No notable events documented.  Last Vitals:  Vitals:   09/20/24 1101 09/20/24 1110  BP: 119/72 121/70  Pulse: 69   Resp: 12 16  Temp:  37.1 C  SpO2: 96%     Last Pain:  Vitals:   09/20/24 1110  TempSrc:   PainSc: 3                  Epifanio Lamar BRAVO

## 2024-09-21 ENCOUNTER — Encounter (HOSPITAL_BASED_OUTPATIENT_CLINIC_OR_DEPARTMENT_OTHER): Payer: Self-pay | Admitting: Surgery

## 2024-09-23 ENCOUNTER — Ambulatory Visit: Payer: Self-pay | Admitting: Surgery

## 2024-09-23 LAB — SURGICAL PATHOLOGY

## 2024-09-26 ENCOUNTER — Ambulatory Visit: Payer: Self-pay | Admitting: Surgery

## 2024-09-27 ENCOUNTER — Ambulatory Visit: Payer: Self-pay | Admitting: Surgery

## 2024-09-27 ENCOUNTER — Encounter: Payer: Self-pay | Admitting: *Deleted

## 2024-09-28 ENCOUNTER — Other Ambulatory Visit: Payer: Self-pay

## 2024-09-28 ENCOUNTER — Encounter (HOSPITAL_BASED_OUTPATIENT_CLINIC_OR_DEPARTMENT_OTHER): Payer: Self-pay | Admitting: Surgery

## 2024-09-30 ENCOUNTER — Telehealth: Payer: Self-pay | Admitting: Genetic Counselor

## 2024-09-30 ENCOUNTER — Encounter (HOSPITAL_BASED_OUTPATIENT_CLINIC_OR_DEPARTMENT_OTHER)
Admission: RE | Admit: 2024-09-30 | Discharge: 2024-09-30 | Disposition: A | Discharge status: Home / Self Care | Source: Ambulatory Visit | Attending: Surgery | Admitting: Surgery

## 2024-09-30 DIAGNOSIS — I10 Essential (primary) hypertension: Secondary | ICD-10-CM | POA: Insufficient documentation

## 2024-09-30 DIAGNOSIS — Z01812 Encounter for preprocedural laboratory examination: Secondary | ICD-10-CM | POA: Insufficient documentation

## 2024-09-30 DIAGNOSIS — Z01818 Encounter for other preprocedural examination: Secondary | ICD-10-CM | POA: Diagnosis present

## 2024-09-30 LAB — BASIC METABOLIC PANEL WITH GFR
Anion gap: 12 (ref 5–15)
BUN: 16 mg/dL (ref 8–23)
CO2: 27 mmol/L (ref 22–32)
Calcium: 9.2 mg/dL (ref 8.9–10.3)
Chloride: 96 mmol/L — ABNORMAL LOW (ref 98–111)
Creatinine, Ser: 0.85 mg/dL (ref 0.44–1.00)
GFR, Estimated: >60 mL/min (ref >60)
Glucose, Bld: 86 mg/dL (ref 70–99)
Potassium: 4.6 mmol/L (ref 3.5–5.1)
Sodium: 135 mmol/L (ref 135–145)

## 2024-09-30 NOTE — Telephone Encounter (Signed)
 Spoke to Beth Fuller regarding results of genetic testing, which she has viewed via the [facility] portal. Requested call back Monday afternoon to review in detail.

## 2024-10-03 ENCOUNTER — Ambulatory Visit: Payer: Self-pay | Admitting: Genetic Counselor

## 2024-10-03 DIAGNOSIS — Z1379 Encounter for other screening for genetic and chromosomal anomalies: Secondary | ICD-10-CM | POA: Insufficient documentation

## 2024-10-03 NOTE — Telephone Encounter (Signed)
 I spoke to Beth Fuller to review results of genetic testing, completed with [facility]'s MultiCancer panel. Testing did not identify any variants known to increase the risk for cancer.  Discussed that we do not know why she has cancer or why there is cancer in the family. It is possible that the cancers in history occurred by chance or due to environmental factors. It is possible there are family members that have a variant that she did not inherit. It is also possible there is a hereditary cause for cancer that cannot be identified with current technology or due to a gene that was not tested. It will be important for her to keep in contact with genetics to keep up with whether additional testing may be needed.  Please see counseling note for further detail on this result.

## 2024-10-03 NOTE — Progress Notes (Signed)
 HPI:  Beth Fuller was previously seen in the [facility] due to a personal and family history of cancer and concerns regarding a hereditary predisposition to cancer. Please refer to our prior cancer genetics clinic note for more information regarding our discussion, assessment and recommendations, at the time. Beth Fuller recent genetic test results were disclosed to her, as were recommendations warranted by these results. These results and recommendations are discussed in more detail below.  Results were disclosed by telephone on 10/03/24.   CANCER HISTORY:  Oncology History  Ductal carcinoma in situ (DCIS) of left breast  08/22/2024 Initial Diagnosis   Ductal carcinoma in situ (DCIS) of left breast   08/24/2024 Cancer Staging   Staging form: Breast, AJCC 8th Edition - Clinical: Stage 0 (cTis (DCIS), cN0, cM0, G2, ER+, PR+) - Signed by Loretha Ash, MD on 08/24/2024 Nuclear grade: G2 Histologic grading system: 3 grade system   09/06/2024 Genetic Testing   Negative MultiCancer panel through [facility]. Report date 09/06/24.   The [facility] MultiCancers panel includes analysis of the following 70 genes:AIP, ALK, APC, ATM, AXIN2, BAP1, BARD1, BLM, BMPR1A, BRCA1, BRCA2, BRIP1, CDC73, CDH1, CDK4, CDKN1B, CDKN2A, CHEK2, CTNNA1, DICER1, EGFR, EPCAM, FH, FLCN, GREM1, HOXB13, KIT, LZTR1, MAX, MBD4, MEN1, MET, MITF, MLH1, MSH2, MSH6, MUTYH, NF1, NF2, NTHL1, PALB2, PDGFRA, PMS2, POLD1, POLE, POT1, PRKAR1A, PTCH1, PTEN, RAD51C, RAD51D, RB1, RET, SDHA, SDHAF2, SDHB, SDHC, SDHD, SMAD4, SMARCA4, SMARCB1, SMARCE1, STK11, SUFU, TMEM127, TP53, TSC1, TSC2, VHL.      FAMILY HISTORY:  We obtained a detailed, 4-generation family history.  Significant diagnoses are listed below: Family History  Problem Relation Age of Onset   Lung cancer Paternal Grandfather    Colon cancer Paternal Grandmother 61   Sudden death Grandchild 0   Brain cancer Paternal Aunt 39   Pancreatic cancer Paternal  Uncle 71   Lung cancer Paternal Uncle    Breast cancer Paternal Cousin 52    Pedigree Summary:  Beth Fuller has a family history of: Breast cancer- paternal 1st cousin diagnosed at age 35 Brain cancer - paternal aunt at 45 Pancreatic cancer - paternal uncle at 47 Colon Cancer - paternal grandmother had colon cancer at 47 Lung Cancer - paternal grandfather (non-smoker) Beth Fuller is aware of relatives completing genetic testing for hereditary cancer risks.  Two paternal 1st cousins with BRCA variants that were maternally inherited. (Meaning that Beth Fuller is not at risk for these variants, as these are the children of her paternal uncle and she is not biologically related to their mother.) There is no reported Ashkenazi Jewish ancestry.  There is no known consanguinity.   GENETIC TEST RESULTS: Genetic testing reported out on 09/06/24 through the [facility] MultiCancer panel found no pathogenic mutations. The [facility] MultiCancers panel includes analysis of the following 70 genes:AIP, ALK, APC, ATM, AXIN2, BAP1, BARD1, BLM, BMPR1A, BRCA1, BRCA2, BRIP1, CDC73, CDH1, CDK4, CDKN1B, CDKN2A, CHEK2, CTNNA1, DICER1, EGFR, EPCAM, FH, FLCN, GREM1, HOXB13, KIT, LZTR1, MAX, MBD4, MEN1, MET, MITF, MLH1, MSH2, MSH6, MUTYH, NF1, NF2, NTHL1, PALB2, PDGFRA, PMS2, POLD1, POLE, POT1, PRKAR1A, PTCH1, PTEN, RAD51C, RAD51D, RB1, RET, SDHA, SDHAF2, SDHB, SDHC, SDHD, SMAD4, SMARCA4, SMARCB1, SMARCE1, STK11, SUFU, TMEM127, TP53, TSC1, TSC2, VHL. The test report has been scanned into EPIC and is located under the Molecular Pathology section of the Results Review tab.  A portion of the result report is included below for reference.     We discussed with Beth Fuller that because current genetic testing is not perfect,  it is possible there may be a gene mutation in one of these genes that current testing cannot detect, but that chance is small.  We also discussed, that there could be another gene that has not yet been  discovered, or that we have not yet tested, that is responsible for the cancer diagnoses in the family. It is also possible there is a hereditary cause for the cancer in the family that Beth Fuller did not inherit and therefore was not identified in her testing.  Therefore, it is important to remain in touch with cancer genetics in the future so that we can continue to offer Beth Fuller the most up to date genetic testing.   ADDITIONAL GENETIC TESTING: We discussed with Beth Fuller that her genetic testing was fairly extensive.  If there are genes identified to increase cancer risk that can be analyzed in the future, we would be happy to discuss and coordinate this testing at that time.    CANCER SCREENING RECOMMENDATIONS: Beth Fuller test result is considered negative (normal).  This means that we have not identified a hereditary cause for her personal and family history of cancer at this time. Most cancers happen by chance and this negative test suggests that her personal and family history of cancer may fall into this category.    Possible reasons for Beth Fuller negative genetic test include:  1. There may be a gene mutation in one of these genes that current testing methods cannot detect. The likelihood of this is low, but possible.   2. There could be another gene that has not yet been discovered, or that we have not yet tested, that is responsible for the cancer diagnoses in the family.  3.  There may be no hereditary risk for cancer in the family. The cancers in Beth Fuller and/or her family may be sporadic/familial or due to other genetic and environmental factors. 4. It is also possible there is a hereditary cause for the cancer in the family that Beth Fuller did not inherit.  Therefore, it is recommended she continue to follow the cancer management and screening guidelines provided by her oncology and primary healthcare provider. An individual's cancer risk and medical management are not  determined by genetic test results alone. Overall cancer risk assessment incorporates additional factors, including personal medical history, family history, and any available genetic information that may result in a personalized plan for cancer prevention and surveillance  RECOMMENDATIONS FOR FAMILY MEMBERS:  Individuals in this family might be at some increased risk of developing cancer, over the general population risk, simply due to the family history of cancer.  We recommended women in this family have a yearly mammogram beginning at age 5, or 10 years younger than the earliest onset of cancer, an annual clinical breast exam, and perform monthly breast self-exams. Women in this family should also have a gynecological exam as recommended by their primary provider. All family members should be referred for colonoscopy starting at age 63, or 62 years younger than the earliest onset of cancer.  Other relatives may benefit from completing their own genetic testing, especially if they have been diagnosed with cancer.   FOLLOW-UP: Lastly, we discussed with Beth Fuller that cancer genetics is a rapidly advancing field and it is possible that new genetic tests will be appropriate for her and/or her family members in the future. We encouraged her to remain in contact with cancer genetics on an annual basis so we can update her personal and  family histories and let her know of advances in cancer genetics that may benefit this family.   Our contact number was provided. Beth Fuller questions were answered to her satisfaction, and she knows she is welcome to call us  at anytime with additional questions or concerns.   Burnard Ogren, MS, [facility] Licensed, [email or web address] [phone]

## 2024-10-04 ENCOUNTER — Ambulatory Visit (HOSPITAL_BASED_OUTPATIENT_CLINIC_OR_DEPARTMENT_OTHER): Admitting: Anesthesiology

## 2024-10-04 ENCOUNTER — Ambulatory Visit (HOSPITAL_BASED_OUTPATIENT_CLINIC_OR_DEPARTMENT_OTHER)
Admission: RE | Admit: 2024-10-04 | Discharge: 2024-10-04 | Disposition: A | Discharge status: Home / Self Care | Attending: Surgery | Admitting: Surgery

## 2024-10-04 ENCOUNTER — Encounter (HOSPITAL_BASED_OUTPATIENT_CLINIC_OR_DEPARTMENT_OTHER): Payer: Self-pay | Admitting: Surgery

## 2024-10-04 ENCOUNTER — Encounter (HOSPITAL_BASED_OUTPATIENT_CLINIC_OR_DEPARTMENT_OTHER)
Admission: RE | Disposition: A | Discharge status: Home / Self Care | Payer: Self-pay | Source: Home / Self Care | Attending: Surgery

## 2024-10-04 DIAGNOSIS — D0512 Intraductal carcinoma in situ of left breast: Secondary | ICD-10-CM | POA: Insufficient documentation

## 2024-10-04 DIAGNOSIS — I10 Essential (primary) hypertension: Secondary | ICD-10-CM | POA: Insufficient documentation

## 2024-10-04 DIAGNOSIS — Z79899 Other long term (current) drug therapy: Secondary | ICD-10-CM | POA: Insufficient documentation

## 2024-10-04 DIAGNOSIS — G4730 Sleep apnea, unspecified: Secondary | ICD-10-CM | POA: Diagnosis not present

## 2024-10-04 DIAGNOSIS — N6032 Fibrosclerosis of left breast: Secondary | ICD-10-CM | POA: Diagnosis not present

## 2024-10-04 HISTORY — PX: RE-EXCISION OF BREAST LUMPECTOMY: SHX6048

## 2024-10-04 SURGERY — EXCISION, LESION, BREAST
Anesthesia: General | Site: Breast | Laterality: Left

## 2024-10-04 MED ORDER — OXYCODONE HCL 5 MG PO TABS: 5.0000 mg | ORAL_TABLET | Freq: Four times a day (QID) | ORAL | 0 refills | Status: AC | PRN

## 2024-10-04 MED ORDER — FENTANYL CITRATE (PF) 100 MCG/2ML IJ SOLN
INTRAMUSCULAR | Status: DC | PRN
  Administered 2024-10-04 (×2): 50 ug via INTRAVENOUS

## 2024-10-04 MED ORDER — FENTANYL CITRATE (PF) 100 MCG/2ML IJ SOLN
INTRAMUSCULAR | Status: AC
  Filled 2024-10-04: qty 2

## 2024-10-04 MED ORDER — ACETAMINOPHEN 500 MG PO TABS
ORAL_TABLET | ORAL | Status: AC
  Filled 2024-10-04: qty 2

## 2024-10-04 MED ORDER — PHENYLEPHRINE HCL (PRESSORS) 10 MG/ML IV SOLN
INTRAVENOUS | Status: DC | PRN
  Administered 2024-10-04 (×2): 80 ug via INTRAVENOUS

## 2024-10-04 MED ORDER — 0.9 % SODIUM CHLORIDE (POUR BTL) OPTIME
TOPICAL | Status: DC | PRN
Start: 2024-10-04 — End: 2024-10-04
  Administered 2024-10-04: 500 mL

## 2024-10-04 MED ORDER — FENTANYL CITRATE (PF) 100 MCG/2ML IJ SOLN
25.0000 ug | INTRAMUSCULAR | Status: DC | PRN
  Administered 2024-10-04: 50 ug via INTRAVENOUS

## 2024-10-04 MED ORDER — PROPOFOL 10 MG/ML IV BOLUS
INTRAVENOUS | Status: AC
  Filled 2024-10-04: qty 20

## 2024-10-04 MED ORDER — CHLORHEXIDINE GLUCONATE CLOTH 2 % EX PADS: 6.0000 | MEDICATED_PAD | Freq: Once | CUTANEOUS | Status: DC

## 2024-10-04 MED ORDER — EPHEDRINE SULFATE (PRESSORS) 25 MG/5ML IV SOSY
PREFILLED_SYRINGE | INTRAVENOUS | Status: DC | PRN
  Administered 2024-10-04 (×2): 10 mg via INTRAVENOUS

## 2024-10-04 MED ORDER — OXYCODONE HCL 5 MG/5ML PO SOLN: 5.0000 mg | Freq: Once | ORAL | Status: AC | PRN

## 2024-10-04 MED ORDER — BUPIVACAINE-EPINEPHRINE 0.25% -1:200000 IJ SOLN
INTRAMUSCULAR | Status: DC | PRN
  Administered 2024-10-04: 10 mL

## 2024-10-04 MED ORDER — OXYCODONE HCL 5 MG PO TABS
ORAL_TABLET | ORAL | Status: AC
  Filled 2024-10-04: qty 1

## 2024-10-04 MED ORDER — CEFAZOLIN SODIUM-DEXTROSE 2-4 GM/100ML-% IV SOLN
2.0000 g | INTRAVENOUS | Status: AC
  Administered 2024-10-04: 2 g via INTRAVENOUS

## 2024-10-04 MED ORDER — ONDANSETRON HCL 4 MG/2ML IJ SOLN
INTRAMUSCULAR | Status: DC | PRN
  Administered 2024-10-04: 4 mg via INTRAVENOUS

## 2024-10-04 MED ORDER — LIDOCAINE 2% (20 MG/ML) 5 ML SYRINGE
INTRAMUSCULAR | Status: DC | PRN
  Administered 2024-10-04: 60 mg via INTRAVENOUS

## 2024-10-04 MED ORDER — PHENYLEPHRINE HCL (PRESSORS) 10 MG/ML IV SOLN
INTRAVENOUS | Status: AC
  Filled 2024-10-04: qty 1

## 2024-10-04 MED ORDER — ONDANSETRON HCL 4 MG/2ML IJ SOLN
INTRAMUSCULAR | Status: AC
  Filled 2024-10-04: qty 2

## 2024-10-04 MED ORDER — AMISULPRIDE (ANTIEMETIC) 5 MG/2ML IV SOLN: 10.0000 mg | Freq: Once | INTRAVENOUS | Status: DC | PRN

## 2024-10-04 MED ORDER — ACETAMINOPHEN 500 MG PO TABS
1000.0000 mg | ORAL_TABLET | Freq: Once | ORAL | Status: AC
  Administered 2024-10-04: 1000 mg via ORAL

## 2024-10-04 MED ORDER — OXYCODONE HCL 5 MG PO TABS
5.0000 mg | ORAL_TABLET | Freq: Once | ORAL | Status: AC | PRN
  Administered 2024-10-04: 5 mg via ORAL

## 2024-10-04 MED ORDER — DEXAMETHASONE SODIUM PHOSPHATE 4 MG/ML IJ SOLN
INTRAMUSCULAR | Status: DC | PRN
  Administered 2024-10-04: 10 mg via INTRAVENOUS

## 2024-10-04 MED ORDER — MIDAZOLAM HCL 5 MG/5ML IJ SOLN
INTRAMUSCULAR | Status: DC | PRN
  Administered 2024-10-04: 2 mg via INTRAVENOUS

## 2024-10-04 MED ORDER — PROPOFOL 10 MG/ML IV BOLUS
INTRAVENOUS | Status: DC | PRN
  Administered 2024-10-04: 150 mg via INTRAVENOUS

## 2024-10-04 MED ORDER — EPHEDRINE 5 MG/ML INJ
INTRAVENOUS | Status: AC
Start: 2024-10-04 — End: 2024-10-04
  Filled 2024-10-04: qty 5

## 2024-10-04 MED ORDER — MIDAZOLAM HCL 2 MG/2ML IJ SOLN
INTRAMUSCULAR | Status: AC
Start: 2024-10-04 — End: 2024-10-04
  Filled 2024-10-04: qty 2

## 2024-10-04 MED ORDER — LACTATED RINGERS IV SOLN: INTRAVENOUS | Status: DC

## 2024-10-04 SURGICAL SUPPLY — 39 items
BINDER BREAST LRG (GAUZE/BANDAGES/DRESSINGS) IMPLANT
BINDER BREAST MEDIUM (GAUZE/BANDAGES/DRESSINGS) IMPLANT
BINDER BREAST XLRG (GAUZE/BANDAGES/DRESSINGS) IMPLANT
BINDER BREAST XXLRG (GAUZE/BANDAGES/DRESSINGS) IMPLANT
BLADE SURG 15 STRL LF DISP TIS (BLADE) ×1 IMPLANT
CANISTER SUCT 1200ML W/VALVE (MISCELLANEOUS) ×1 IMPLANT
CHLORAPREP W/TINT 26 (MISCELLANEOUS) ×1 IMPLANT
CLIP APPLIE 9.375 MED OPEN (MISCELLANEOUS) IMPLANT
COVER BACK TABLE 60X90IN (DRAPES) ×1 IMPLANT
COVER MAYO STAND STRL (DRAPES) ×1 IMPLANT
DERMABOND ADVANCED .7 DNX12 (GAUZE/BANDAGES/DRESSINGS) ×1 IMPLANT
DRAPE LAPAROSCOPIC ABDOMINAL (DRAPES) IMPLANT
DRAPE LAPAROTOMY 100X72 PEDS (DRAPES) ×1 IMPLANT
DRAPE UTILITY XL STRL (DRAPES) ×1 IMPLANT
ELECT COATED BLADE 2.86 ST (ELECTRODE) ×1 IMPLANT
ELECTRODE REM PT RTRN 9FT ADLT (ELECTROSURGICAL) ×1 IMPLANT
GLOVE BIOGEL PI IND STRL 8 (GLOVE) ×1 IMPLANT
GLOVE ECLIPSE 8.0 STRL XLNG CF (GLOVE) ×1 IMPLANT
GOWN STRL REUS W/ TWL LRG LVL3 (GOWN DISPOSABLE) ×2 IMPLANT
GOWN STRL REUS W/ TWL XL LVL3 (GOWN DISPOSABLE) ×1 IMPLANT
HEMOSTAT ARISTA ABSORB 3G PWDR (HEMOSTASIS) IMPLANT
KIT MARKER MARGIN INK (KITS) IMPLANT
NDL HYPO 25X1 1.5 SAFETY (NEEDLE) ×1 IMPLANT
NEEDLE HYPO 25X1 1.5 SAFETY (NEEDLE) ×1 IMPLANT
PACK BASIN DAY SURGERY FS (CUSTOM PROCEDURE TRAY) ×1 IMPLANT
PENCIL SMOKE EVACUATOR (MISCELLANEOUS) ×1 IMPLANT
SLEEVE SCD COMPRESS KNEE MED (STOCKING) ×1 IMPLANT
SOLN 0.9% NACL POUR BTL 1000ML (IV SOLUTION) ×1 IMPLANT
SPIKE FLUID TRANSFER (MISCELLANEOUS) ×1 IMPLANT
SPONGE T-LAP 4X18 ~~LOC~~+RFID (SPONGE) ×1 IMPLANT
STAPLER SKIN PROX WIDE 3.9 (STAPLE) IMPLANT
SUT MON AB 4-0 PC3 18 (SUTURE) ×1 IMPLANT
SUT SILK 2 0 SH (SUTURE) IMPLANT
SUT VICRYL 3-0 CR8 SH (SUTURE) ×1 IMPLANT
SYR CONTROL 10ML LL (SYRINGE) ×1 IMPLANT
TOWEL GREEN STERILE FF (TOWEL DISPOSABLE) ×2 IMPLANT
TRAY FAXITRON CT DISP (TRAY / TRAY PROCEDURE) IMPLANT
TUBE CONNECTING 20X1/4 (TUBING) ×1 IMPLANT
YANKAUER SUCT BULB TIP NO VENT (SUCTIONS) ×1 IMPLANT

## 2024-10-04 NOTE — Op Note (Signed)
 Preoperative diagnosis: Left breast DCIS status postlumpectomy with positive margins  Postoperative diagnosis: Same  Procedure: Left breast reexcision lumpectomy  Surgeon: Debby Shipper, MD  Anesthesia: LMA with 0.25% Marcaine with epinephrine  EBL: Minimal  Specimen: Posterior margin, lateral margin oriented with ink  Drains: None  Indications for procedure: The patient is a 68 year old female who had a lumpectomy for left breast DCIS.  She had focal positivity of her posterior lateral margins presents for reexcision today.The procedure has been discussed with the patient. Alternatives to surgery have been discussed with the patient.  Risks of surgery include bleeding,  Infection,  Seroma formation, death,  and the need for further surgery.   The patient understands and wishes to proceed.   Description of procedure: The patient was met in the holding area questions were answered.  Left side was marked as correct site.  She was taken back to the operative room placed upon upon the operative room table.  After induction of general anesthesia, left breast was prepped and draped in sterile fashion and timeout performed.  The incision was opened and the seroma was evacuated.  We then dissected and removed the sutures to expose the lumpectomy cavity.  The posterior margin was excised as well as the lateral margin excised and oriented with ink.  Hemostasis achieved with cautery.  Irrigation was used and local anesthetic infiltrated throughout the lumpectomy cavity.  New clips were applied.  The tissue was then sent to pathology after orientation.  Cavity closed with 3-0 Vicryl.  Skin closed with 4-0 Monocryl.  Dermabond applied.  All counts found to be correct.  The patient was awoke extubated taken to recovery in satisfactory condition.

## 2024-10-04 NOTE — Transfer of Care (Signed)
 Immediate Anesthesia Transfer of Care Note  Patient: Beth Fuller  Procedure(s) Performed: Left breast re-excision lumpectomy (Left: Breast)  Patient Location: PACU  Anesthesia Type:General  Level of Consciousness: awake, alert , and oriented  Airway & Oxygen Therapy: Patient Spontanous Breathing and Patient connected to face mask oxygen  Post-op Assessment: Report given to RN and Post -op Vital signs reviewed and stable  Post vital signs: Reviewed and stable  Last Vitals:  Vitals Value Taken Time  BP 121/63 10/04/24 15:37  Temp    Pulse 72 10/04/24 15:40  Resp 12 10/04/24 15:40  SpO2 100 % 10/04/24 15:40  Vitals shown include unfiled device data.  Last Pain:  Vitals:   10/04/24 1314  TempSrc: Temporal  PainSc: 0-No pain      Patients Stated Pain Goal: 3 (10/04/24 1314)  Complications: No notable events documented.

## 2024-10-04 NOTE — Interval H&P Note (Signed)
 History and Physical Interval Note:  10/04/2024 2:12 PM  [name]  has presented today for surgery, with the diagnosis of DCIS LEFT BREAST.  The various methods of treatment have been discussed with the patient and family. After consideration of risks, benefits and other options for treatment, the patient has consented to  Procedure(s): EXCISION, LESION, BREAST (Left) as a surgical intervention.  The patient's history has been reviewed, patient examined, no change in status, stable for surgery.  I have reviewed the patient's chart and labs.  Questions were answered to the patient's satisfaction.     Cheick Suhr A Yonathan Perrow

## 2024-10-04 NOTE — H&P (Signed)
 Beth Fuller is an 68 y.o. female.   Chief Complaint: left breast DCIS  HPI: Pt presents for re excision left breast lumpectomy due to positive margins  Past Medical History:  Diagnosis Date   Cancer (HCC) 08/2024   right breast DCIS   Family history of brain cancer    Paternal aunt dx 80   Family history of breast cancer    Paternal Cousin   Family history of pancreatic cancer    Paternal Uncle   GERD (gastroesophageal reflux disease)    Goiter with hyperthyroidism    Graves' disease with exophthalmos    Hypertension    Hypothyroidism, postsurgical    Sleep apnea    Thyroiditis, autoimmune     Past Surgical History:  Procedure Laterality Date   BREAST BIOPSY Left 08/11/2024   MM LT BREAST BX W LOC DEV EA AD LESION IMG BX SPEC STEREO GUIDE 08/11/2024 [facility]   BREAST BIOPSY Left 08/11/2024   MM LT BREAST BX W LOC DEV 1ST LESION IMAGE BX SPEC STEREO GUIDE 08/11/2024 [facility]   BREAST BIOPSY  09/16/2024   MM LT RADIOACTIVE SEED LOC MAMMO GUIDE 09/16/2024 [facility]   BREAST BIOPSY  09/16/2024   MM LT RADIOACTIVE SEED EA ADD LESION LOC MAMMO GUIDE 09/16/2024 [facility]   BREAST BIOPSY  09/16/2024   MM LT RADIOACTIVE SEED EA ADD LESION LOC MAMMO GUIDE 09/16/2024 [facility]   BREAST LUMPECTOMY WITH RADIOACTIVE SEED LOCALIZATION Left 09/20/2024   Procedure: BREAST LUMPECTOMY WITH RADIOACTIVE SEED LOCALIZATION;  Surgeon: Vanderbilt Ned, MD;  Location: [facility];  Service: General;  Laterality: Left;  BRACKETED LEFT BREAST LUMPECTOMY   THYROIDECTOMY, PARTIAL  1984   TUBAL LIGATION  1987    Family History  Problem Relation Age of Onset   Lung cancer Paternal Grandfather    Colon cancer Paternal Grandmother 97   Sudden death Grandchild 0   Brain cancer Paternal Aunt 39   Pancreatic cancer Paternal Uncle 61   Lung cancer Paternal Uncle    Breast cancer Paternal Cousin 61   Social History:  reports that she has never  smoked. She has never used smokeless tobacco. She reports current alcohol use. She reports that she does not use drugs.  Allergies: No Known Allergies  No medications prior to admission.    No results found for this or any previous visit (from the past 48 hours). No results found.  Review of Systems  All other systems reviewed and are negative.   Height 5' 5 (1.651 m), weight 69 kg. Physical Exam Cardiovascular:     Rate and Rhythm: Normal rate.  Pulmonary:     Effort: Pulmonary effort is normal.  Chest:       Comments: Left breast wound clean intact  Neurological:     Mental Status: She is alert.      Assessment/Plan Left breast DCIS \\positive  margin Plan re excision   Ned DELENA Vanderbilt, MD 10/04/2024, 7:18 AM

## 2024-10-04 NOTE — Anesthesia Preprocedure Evaluation (Signed)
 Anesthesia Evaluation  Patient identified by MRN, date of birth, ID band Patient awake    Reviewed: Allergy & Precautions, NPO status , Patient's Chart, lab work & pertinent test results, reviewed documented beta blocker date and time   Airway Mallampati: III  TM Distance: >3 FB Neck ROM: Full  Mouth opening: Limited Mouth Opening  Dental no notable dental hx. (+) Teeth Intact, Dental Advisory Given   Pulmonary sleep apnea and Continuous Positive Airway Pressure Ventilation    Pulmonary exam normal breath sounds clear to auscultation       Cardiovascular hypertension, Pt. on home beta blockers and Pt. on medications Normal cardiovascular exam Rhythm:Regular Rate:Normal     Neuro/Psych negative neurological ROS  negative psych ROS   GI/Hepatic Neg liver ROS,GERD  ,,  Endo/Other  Hypothyroidism    Renal/GU negative Renal ROS  negative genitourinary   Musculoskeletal negative musculoskeletal ROS (+)    Abdominal   Peds  Hematology negative hematology ROS (+)   Anesthesia Other Findings   Reproductive/Obstetrics                              Anesthesia Physical Anesthesia Plan  ASA: 2  Anesthesia Plan: General   Post-op Pain Management: Tylenol PO (pre-op)*   Induction: Intravenous  PONV Risk Score and Plan: 3 and Ondansetron, Dexamethasone and Midazolam  Airway Management Planned: LMA  Additional Equipment:   Intra-op Plan:   Post-operative Plan: Extubation in OR  Informed Consent: I have reviewed the patients History and Physical, chart, labs and discussed the procedure including the risks, benefits and alternatives for the proposed anesthesia with the patient or authorized representative who has indicated his/her understanding and acceptance.     Dental advisory given  Plan Discussed with: CRNA  Anesthesia Plan Comments:         Anesthesia Quick Evaluation

## 2024-10-04 NOTE — Anesthesia Procedure Notes (Signed)
 Procedure Name: LMA Insertion Date/Time: 10/04/2024 2:54 PM  Performed by: Julieanne Fairy BROCKS, CRNAPre-anesthesia Checklist: Patient identified, Emergency Drugs available, Suction available and Patient being monitored Patient Re-evaluated:Patient Re-evaluated prior to induction Oxygen Delivery Method: Circle system utilized Preoxygenation: Pre-oxygenation with 100% oxygen Induction Type: IV induction Ventilation: Mask ventilation without difficulty LMA: LMA inserted LMA Size: 4.0 Number of attempts: 1 Airway Equipment and Method: Bite block Placement Confirmation: positive ETCO2 Tube secured with: Tape Dental Injury: Teeth and Oropharynx as per pre-operative assessment

## 2024-10-04 NOTE — Discharge Instructions (Addendum)
 [facility] [employer] Office Phone Number [phone]  BREAST BIOPSY/ PARTIAL MASTECTOMY: POST OP INSTRUCTIONS  Always review your discharge instruction sheet given to you by the facility where your surgery was performed.  IF YOU HAVE DISABILITY OR FAMILY LEAVE FORMS, YOU MUST BRING THEM TO THE OFFICE FOR PROCESSING.  DO NOT GIVE THEM TO YOUR DOCTOR.  A prescription for pain medication may be given to you upon discharge.  Take your pain medication as prescribed, if needed.  If narcotic pain medicine is not needed, then you may take acetaminophen (Tylenol) or ibuprofen (Advil) as needed. Take your usually prescribed medications unless otherwise directed If you need a refill on your pain medication, please contact your pharmacy.  They will contact our office to request authorization.  Prescriptions will not be filled after 5pm or on week-ends. You should eat very light the first 24 hours after surgery, such as soup, crackers, pudding, etc.  Resume your normal diet the day after surgery. Most patients will experience some swelling and bruising in the breast.  Ice packs and a good support bra will help.  Swelling and bruising can take several days to resolve.  It is common to experience some constipation if taking pain medication after surgery.  Increasing fluid intake and taking a stool softener will usually help or prevent this problem from occurring.  A mild laxative (Milk of Magnesia or Miralax) should be taken according to package directions if there are no bowel movements after 48 hours. Unless discharge instructions indicate otherwise, you may remove your bandages 24-48 hours after surgery, and you may shower at that time.  You may have steri-strips (small skin tapes) in place directly over the incision.  These strips should be left on the skin for 7-10 days.  If your surgeon used skin glue on the incision, you may shower in 24 hours.  The glue will flake off over the next 2-3 weeks.  Any  sutures or staples will be removed at the office during your follow-up visit. ACTIVITIES:  You may resume regular daily activities (gradually increasing) beginning the next day.  Wearing a good support bra or sports bra minimizes pain and swelling.  You may have sexual intercourse when it is comfortable. You may drive when you no longer are taking prescription pain medication, you can comfortably wear a seatbelt, and you can safely maneuver your car and apply brakes. RETURN TO WORK:  ______________________________________________________________________________________ Beth Fuller should see your doctor in the office for a follow-up appointment approximately two weeks after your surgery.  Your doctor's nurse will typically make your follow-up appointment when she calls you with your pathology report.  Expect your pathology report 2-3 business days after your surgery.  You may call to check if you do not hear from us  after three days. OTHER INSTRUCTIONS: _______________________________________________________________________________________________ _____________________________________________________________________________________________________________________________________ _____________________________________________________________________________________________________________________________________ _____________________________________________________________________________________________________________________________________  WHEN TO CALL YOUR DOCTOR: Fever over 101.0 Nausea and/or vomiting. Extreme swelling or bruising. Continued bleeding from incision. Increased pain, redness, or drainage from the incision.  The clinic staff is available to answer your questions during regular business hours.  Please don't hesitate to call and ask to speak to one of the nurses for clinical concerns.  If you have a medical emergency, go to the nearest emergency room or call 911.  A surgeon from [facility] is always on call at the hospital.  For further questions, please visit [email or web address]    Post Anesthesia Home Care Instructions  Activity: Get plenty of rest for the remainder of  the day. A responsible individual must stay with you for 24 hours following the procedure.  For the next 24 hours, DO NOT: -Drive a car -Advertising copywriter -Drink alcoholic beverages -Take any medication unless instructed by your physician -Make any legal decisions or sign important papers.  Meals: Start with liquid foods such as gelatin or soup. Progress to regular foods as tolerated. Avoid greasy, spicy, heavy foods. If nausea and/or vomiting occur, drink only clear liquids until the nausea and/or vomiting subsides. Call your physician if vomiting continues.  Special Instructions/Symptoms: Your throat may feel dry or sore from the anesthesia or the breathing tube placed in your throat during surgery. If this causes discomfort, gargle with warm salt water. The discomfort should disappear within 24 hours.  Next dose of tylenol will be at 7:20pm tonight if needed

## 2024-10-05 ENCOUNTER — Encounter (HOSPITAL_BASED_OUTPATIENT_CLINIC_OR_DEPARTMENT_OTHER): Payer: Self-pay | Admitting: Surgery

## 2024-10-05 NOTE — Anesthesia Postprocedure Evaluation (Signed)
 Anesthesia Post Note  Patient: Beth Fuller  Procedure(s) Performed: Left breast re-excision lumpectomy (Left: Breast)     Patient location during evaluation: PACU Anesthesia Type: General Level of consciousness: awake and alert Pain management: pain level controlled Vital Signs Assessment: post-procedure vital signs reviewed and stable Respiratory status: spontaneous breathing, nonlabored ventilation, respiratory function stable and patient connected to nasal cannula oxygen Cardiovascular status: blood pressure returned to baseline and stable Postop Assessment: no apparent nausea or vomiting Anesthetic complications: no   No notable events documented.  Last Vitals:  Vitals:   10/04/24 1607 10/04/24 1645  BP: 136/66 (!) 142/65  Pulse: 71 72  Resp: 12   Temp:  (!) 36.2 C  SpO2: 96% 99%    Last Pain:  Vitals:   10/04/24 1629  TempSrc:   PainSc: 4                  Zaryah Seckel L Jacquis Paxton

## 2024-10-06 ENCOUNTER — Other Ambulatory Visit (HOSPITAL_COMMUNITY): Payer: Self-pay | Admitting: *Deleted

## 2024-10-06 DIAGNOSIS — E785 Hyperlipidemia, unspecified: Secondary | ICD-10-CM

## 2024-10-06 LAB — SURGICAL PATHOLOGY

## 2024-10-12 ENCOUNTER — Ambulatory Visit: Payer: Self-pay | Admitting: Surgery

## 2024-10-13 ENCOUNTER — Encounter: Payer: Self-pay | Admitting: *Deleted

## 2024-10-17 ENCOUNTER — Ambulatory Visit: Payer: PRIVATE HEALTH INSURANCE

## 2024-10-17 ENCOUNTER — Ambulatory Visit: Payer: PRIVATE HEALTH INSURANCE | Admitting: Radiation Oncology

## 2024-10-18 ENCOUNTER — Ambulatory Visit (HOSPITAL_BASED_OUTPATIENT_CLINIC_OR_DEPARTMENT_OTHER)
Admission: RE | Admit: 2024-10-18 | Discharge: 2024-10-18 | Disposition: A | Discharge status: Home / Self Care | Payer: PRIVATE HEALTH INSURANCE | Source: Ambulatory Visit | Attending: Family Medicine | Admitting: Family Medicine

## 2024-10-18 DIAGNOSIS — E785 Hyperlipidemia, unspecified: Secondary | ICD-10-CM | POA: Insufficient documentation

## 2024-10-19 ENCOUNTER — Ambulatory Visit: Payer: PRIVATE HEALTH INSURANCE | Admitting: Radiation Oncology

## 2024-10-26 NOTE — Progress Notes (Incomplete)
 Location of Breast Cancer:DUCTAL CARCINOMA IN SITU OF LEFT BREAST   Histology per Pathology Report:   Receptor Status: ER(positive), PR (positive), Her2-neu (), Ki-67()  Did patient present with symptoms (if so, please note symptoms) or was this found on screening mammography?: Screening mammogram  Past/Anticipated interventions by surgeon, if any:  Past/Anticipated interventions by medical oncology, if any: Chemotherapy    Amber Stalls, MD 08/24/2024  Lymphedema issues, if any:  {:18581} {t:21944}   Pain issues, if any:  {:18581} {PAIN DESCRIPTION:21022940}  SAFETY ISSUES: Prior radiation? {:18581} Pacemaker/ICD? {:18581} Possible current pregnancy?{:18581} Is the patient on methotrexate? {:18581}  Current Complaints / other details:  ***

## 2024-10-28 NOTE — Progress Notes (Signed)
 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 female seen in consultation for radiation therapy.  Briefly Beth Fuller has screen detected L breast DCIS, low to intermediate grade, ER/PR+. She was seen preoperatively and we discussed radiation for adjuvant treatment of her cancer after lumpectomy. She underwent lumpectomy on 09/20/24 which returned consistent with DCIS. Extent of DCIS was at least 41 mm. There was expansive comedo necrosis. Margins were positive posteriorly and laterally. There were also close margins anteriorly. Inferior margin was 2 mm. She returned to the OR for re-excision in setting of positive margins and there was no residual DCIS identified. Focally there were a few ducts that were noted to be solid with abundant calcifications suggestive of focal residual ductal carcinoma in situ. These solid ducts were 5 mm from the new inked margin.  Today she reports she is doing well in general. Does have some tenderness at her surgical site. It has also become red and itchy in the last week when she's noticed the glue coming off. She does have some allergies to adhesive tapes.   Reports good range of motion of that arm. Does not anticipate any issues raising it above her head for treatment.  PREVIOUS RADIATION THERAPY: No  AUTOIMMUNE DISEASE: No  MEDICAL DEVICES: No  PREGNANCY: No   PAST MEDICAL HISTORY:  Past Medical History:  Diagnosis Date   Cancer (HCC) 08/2024   right breast DCIS   Family history of brain cancer    Paternal aunt dx 57   Family history of breast cancer    Paternal Cousin   Family history of pancreatic cancer    Paternal  Uncle   GERD (gastroesophageal reflux disease)    Goiter with hyperthyroidism    Graves' disease with exophthalmos    Hypertension    Hypothyroidism, postsurgical    Sleep apnea    Thyroiditis, autoimmune        PAST SURGICAL HISTORY: Past Surgical History:  Procedure Laterality Date   BREAST BIOPSY Left 08/11/2024   MM LT BREAST BX W LOC DEV EA AD LESION IMG BX SPEC STEREO GUIDE 08/11/2024 [facility]   BREAST BIOPSY Left 08/11/2024   MM LT BREAST BX W LOC DEV 1ST LESION IMAGE BX SPEC STEREO GUIDE 08/11/2024 [facility]   BREAST BIOPSY  09/16/2024   MM LT RADIOACTIVE SEED LOC MAMMO GUIDE 09/16/2024 [facility]   BREAST BIOPSY  09/16/2024   MM LT RADIOACTIVE SEED EA ADD LESION LOC MAMMO GUIDE 09/16/2024 [facility]   BREAST BIOPSY  09/16/2024   MM LT RADIOACTIVE SEED EA ADD LESION LOC MAMMO GUIDE 09/16/2024 [facility]   BREAST LUMPECTOMY WITH RADIOACTIVE SEED LOCALIZATION Left 09/20/2024   Procedure: BREAST LUMPECTOMY WITH RADIOACTIVE SEED LOCALIZATION;  Surgeon: Vanderbilt Ned, MD;  Location: [facility];  Service: General;  Laterality: Left;  BRACKETED LEFT BREAST LUMPECTOMY   RE-EXCISION OF BREAST LUMPECTOMY Left 10/04/2024   Procedure: Left breast re-excision lumpectomy;  Surgeon: Vanderbilt Ned, MD;  Location: [facility];  Service: General;  Laterality: Left;   THYROIDECTOMY, PARTIAL  1984   TUBAL LIGATION  1987     FAMILY HISTORY:  Family History  Problem Relation Age of Onset   Lung cancer Paternal Grandfather    Colon cancer Paternal Grandmother 90   Sudden death Grandchild 0   Brain cancer Paternal Aunt 39   Pancreatic cancer Paternal Uncle 57   Lung cancer Paternal Uncle    Breast cancer Paternal Cousin 64     SOCIAL HISTORY:  reports that she has never smoked. She has never used smokeless tobacco. She reports current alcohol use. She reports that she does not use drugs.   ALLERGIES: Patient  has no known allergies.   MEDICATIONS:  Current Outpatient Medications  Medication Sig Dispense Refill   clonazePAM (KLONOPIN) 0.5 MG tablet Take 0.5 mg by mouth at bedtime as needed.     famotidine (PEPCID) 20 MG tablet Take 20 mg by mouth 2 (two) times daily.     gabapentin (NEURONTIN) 300 MG capsule Take 300 mg by mouth 2 (two) times daily.     hydrochlorothiazide (MICROZIDE) 12.5 MG capsule Take 12.5 mg by mouth daily.     ibuprofen  (ADVIL ) 800 MG tablet Take 1 tablet (800 mg total) by mouth every 8 (eight) hours as needed. 30 tablet 0   imipramine (TOFRANIL) 50 MG tablet Take 50 mg by mouth at bedtime.     metoprolol (LOPRESSOR) 50 MG tablet Take 50 mg by mouth 2 (two) times daily.     oxyCODONE  (OXY IR/ROXICODONE ) 5 MG immediate release tablet Take 1 tablet (5 mg total) by mouth every 6 (six) hours as needed for severe pain (pain score 7-10). 15 tablet 0   oxyCODONE  (OXY IR/ROXICODONE ) 5 MG immediate release tablet Take 1 tablet (5 mg total) by mouth every 6 (six) hours as needed for severe pain (pain score 7-10). 15 tablet 0   SYNTHROID  88 MCG tablet TAKE 1 TABLET(88 MCG) BY MOUTH DAILY BEFORE BREAKFAST 90 tablet 1   No current facility-administered medications for this encounter.     REVIEW OF SYSTEMS: The patient reports that she is doing well overall and a review of symptoms is otherwise negative.      PHYSICAL EXAM:  Wt Readings from Last 3 Encounters:  10/31/24 153 lb 4 oz (69.5 kg)  10/04/24 151 lb 0.2 oz (68.5 kg)  09/20/24 152 lb 1.9 oz (69 kg)   Temp Readings from Last 3 Encounters:  10/31/24 (!) 97.1 F (36.2 C) (Temporal)  10/04/24 (!) 97.1 F (36.2 C)  09/20/24 98.7 F (37.1 C)   BP Readings from Last 3 Encounters:  10/31/24 (!) 150/88  10/04/24 (!) 142/65  09/20/24 121/70   Pulse Readings from Last 3 Encounters:  10/31/24 68  10/04/24 72  09/20/24 69    /10   Physical Exam Vitals and nursing note reviewed.  Constitutional:      General: She  is not in acute distress. HENT:     Head: Normocephalic.  Eyes:     Extraocular Movements: Extraocular movements intact.  Cardiovascular:     Rate and Rhythm: Normal rate.  Pulmonary:     Effort: Pulmonary effort is normal. No respiratory distress.  Abdominal:     General: There is no distension.  Musculoskeletal:        General: Normal range of motion.     Cervical back: Normal range of motion.  Skin:    General: Skin is warm and dry.     Findings: Erythema (erythema around L breast lumpectomy/reexcision scar) and rash (small erythematous macules presenting surrounding  surgical scar) present.  Neurological:     General: No focal deficit present.     Mental Status: She is alert.  Psychiatric:        Mood and Affect: Mood normal.      Breast Exam:  Lumpectomy/re-excision scar in the UOQ quadrant with 1 x 1 cm adherent scab, surrounding erythema and several erythematous macules are present. No vesicles or drainage. No palpable masses or nodularity. No nipple inversion, or discharge.   ECOG = 0   LABORATORY DATA:  Lab Results  Component Value Date   WBC 5.0 08/24/2024   HGB 14.0 08/24/2024   HCT 40.6 08/24/2024   MCV 95.3 08/24/2024   PLT 281 08/24/2024   Lab Results  Component Value Date   NA 135 09/30/2024   K 4.6 09/30/2024   CL 96 (L) 09/30/2024   CO2 27 09/30/2024   Lab Results  Component Value Date   ALT 44 08/24/2024   AST 33 08/24/2024   ALKPHOS 76 08/24/2024   BILITOT 0.5 08/24/2024      RADIOGRAPHY:   Mammogram L breast surgical specimen 09/20/24:  FINDINGS: Status post excision of the left breast. Three radioactive seeds and excellent ribbon shaped biopsy marker clips are present, completely intact, and were marked for pathology.   IMPRESSION: Specimen radiograph of the left breast.    PATHOLOGY:  L Breast Re-Excision 10/04/2024:  FINAL MICROSCOPIC DIAGNOSIS:   A. BREAST, LEFT POSTERIOR MARGIN, RE-EXCISION:  -  Benign breast tissue  with fibrosis, fat necrosis, hemorrhage and  histiocytosis with foreign body giant cell reaction consistent with  previous biopsy site.  -  No residual ductal carcinoma in situ present.   B. BREAST, LEFT LATERAL MARGIN, RE-EXCISION:  -  Focal nonviable ducts suspicious for focal residual ductal carcinoma  in situ, see note.    L Breast Lumpectomy 09/20/24:  FINAL MICROSCOPIC DIAGNOSIS:  A. BREAST, LEFT, LUMPECTOMY: - Ductal carcinoma in situ (DCIS). - See cancer summary below. - Intraductal papilloma. - Two biopsy sites with corresponding X and ribbon clips. - Radioactive seeds.  CASE SUMMARY: DUCTAL CARCINOMA IN SITU OF THE BREAST Standard(s): AJCC-UICC 8  SPECIMEN Procedure: Lumpectomy Specimen Laterality: Left  TUMOR Histologic Type: Ductal carcinoma in situ Size (Extent) of DCIS:  Estimated size (extent) of DCIS is at least 41 mm Nuclear Grade: Grade 2 (intermediate) Necrosis: Present, central (expansive comedo necrosis)  MARGINS  Margin Status: DCIS present at margin:      Posterior, unifocal      Lateral, unifocal DCIS is 0.5 mm to anterior margin and 2 mm to inferior margin  REGIONAL LYMPH NODES Regional Lymph node Status: Not applicable (no regional lymph nodes submitted or found)  DISTANT METASTASIS Distant Site(s) Involved, if applicable (select all that apply): Not applicable  PATHOLOGIC STAGE CLASSIFICATION (pTNM, AJCC 8th Edition) TNM Descriptors: Not applicable pTis (DCIS) pN - Not applicable (no regional lymph nodes submitted or found) pM - Not applicable   SPECIAL STUDIES  Breast Biomarker Testing Performed on Previous Biopsy: [id number]       Estrogen Receptor: Positive (100%, strong)       Progesterone Receptor: Positive (30%, strong)       IMPRESSION/PLAN:   Patient with screen detected Stage 0 pTis cN0M0 ductal carcinoma in situ, grade 2, with expansive comedo necrosis who is seen post-operatively for consideration of adjuvant  radiation therapy. We discussed the role of breast irradiation in reducing the risk of local recurrence and improving long-term disease control.  We reviewed  the logistics of treatment in detail, including the need for a CT simulation for treatment planning, followed by several days for contouring, dosimetry, and quality assurance prior to starting therapy.   The planned course of radiation therapy will consist of daily treatments, Monday through Friday, over approximately 4 weeks (3 weeks of radiation to the whole breast and 1 week for a boost). We reviewed that each treatment session typically lasts only a few minutes, though positioning and setup may take additional time. The patient should plan to be in the department for approximately 45 minutes per visit. She will be evaluated by me at least once weekly during treatment to monitor for side effects, assess tolerance, and ensure it is safe to continue therapy.  We discussed acute and subacute side effects which are generally gradual in onset and typically include fatigue, skin erythema, tanning or hyperpigmentation, breast edema or firmness and mild tenderness.  Less common but possible side effects include desquamation of the skin, decreased range of motion of the shoulder, and transient changes in breast size and texture.  Long-term risk such as cosmetic changes, rare risk of rib fracture and cardiopulmonary effects were also reviewed.  Reassured patient that most acute side effects improve over weeks to months after completing therapy.   Patient verbalized understanding of these risks and benefits and she is in agreement with the plan. I will see her in follow-up after surgery. Final treatment planning will depend on her postoperative pathology report, but we anticipate adjuvant whole breast radiation. Do not anticipate the need to radiate her axilla or any nodal regions.   --  Total time spent today in preparation for this visit was 60 minutes.  This included patient care, imaging and path review, documentation, multidisciplinary discussion and coordination of care and follow up.    Estefana HERO. Maritza, M.D.

## 2024-10-31 ENCOUNTER — Encounter: Payer: Self-pay | Admitting: Radiation Oncology

## 2024-10-31 ENCOUNTER — Ambulatory Visit
Admission: RE | Admit: 2024-10-31 | Discharge: 2024-10-31 | Disposition: A | Discharge status: Home / Self Care | Payer: PRIVATE HEALTH INSURANCE | Source: Ambulatory Visit | Attending: Radiation Oncology | Admitting: Radiation Oncology

## 2024-10-31 ENCOUNTER — Ambulatory Visit
Admission: RE | Admit: 2024-10-31 | Discharge: 2024-10-31 | Payer: PRIVATE HEALTH INSURANCE | Attending: Radiation Oncology

## 2024-10-31 ENCOUNTER — Other Ambulatory Visit (HOSPITAL_COMMUNITY)

## 2024-10-31 VITALS — BP 150/88 | HR 68 | Temp 97.1°F | Resp 18 | Ht 65.0 in | Wt 153.2 lb

## 2024-10-31 DIAGNOSIS — D0512 Intraductal carcinoma in situ of left breast: Secondary | ICD-10-CM

## 2024-11-02 ENCOUNTER — Ambulatory Visit
Admission: RE | Admit: 2024-11-02 | Discharge: 2024-11-02 | Disposition: A | Discharge status: Home / Self Care | Payer: PRIVATE HEALTH INSURANCE | Source: Ambulatory Visit | Attending: Radiation Oncology | Admitting: Radiation Oncology

## 2024-11-02 DIAGNOSIS — Z510 Encounter for antineoplastic radiation therapy: Secondary | ICD-10-CM | POA: Insufficient documentation

## 2024-11-02 DIAGNOSIS — Z1721 Progesterone receptor positive status: Secondary | ICD-10-CM | POA: Insufficient documentation

## 2024-11-02 DIAGNOSIS — Z170 Estrogen receptor positive status [ER+]: Secondary | ICD-10-CM | POA: Diagnosis not present

## 2024-11-02 DIAGNOSIS — D0512 Intraductal carcinoma in situ of left breast: Secondary | ICD-10-CM | POA: Insufficient documentation

## 2024-11-02 MED ORDER — TRIAMCINOLONE ACETONIDE 0.1 % EX CREA: 1.0000 | TOPICAL_CREAM | Freq: Two times a day (BID) | CUTANEOUS | 0 refills | Status: AC

## 2024-11-03 ENCOUNTER — Other Ambulatory Visit: Payer: PRIVATE HEALTH INSURANCE

## 2024-11-03 DIAGNOSIS — Z006 Encounter for examination for normal comparison and control in clinical research program: Secondary | ICD-10-CM

## 2024-11-07 DIAGNOSIS — Z510 Encounter for antineoplastic radiation therapy: Secondary | ICD-10-CM | POA: Diagnosis not present

## 2024-11-08 ENCOUNTER — Encounter: Payer: Self-pay | Admitting: *Deleted

## 2024-11-08 DIAGNOSIS — D0512 Intraductal carcinoma in situ of left breast: Secondary | ICD-10-CM

## 2024-11-11 ENCOUNTER — Telehealth: Payer: Self-pay | Admitting: Hematology and Oncology

## 2024-11-11 LAB — GENECONNECT MOLECULAR SCREEN: Genetic Analysis Overall Interpretation: NEGATIVE

## 2024-11-11 NOTE — Telephone Encounter (Signed)
 I left voicemail for patient regarding post xrt follow up. I advised patient to return my call if date/time does not work for 12/21/2024.

## 2024-11-15 ENCOUNTER — Ambulatory Visit
Admission: RE | Admit: 2024-11-15 | Discharge: 2024-11-15 | Disposition: A | Discharge status: Home / Self Care | Payer: PRIVATE HEALTH INSURANCE | Source: Ambulatory Visit | Attending: Radiation Oncology | Admitting: Radiation Oncology

## 2024-11-15 ENCOUNTER — Telehealth: Payer: Self-pay | Admitting: Adult Health

## 2024-11-15 ENCOUNTER — Other Ambulatory Visit: Payer: Self-pay

## 2024-11-15 DIAGNOSIS — Z510 Encounter for antineoplastic radiation therapy: Secondary | ICD-10-CM | POA: Diagnosis not present

## 2024-11-15 LAB — RAD ONC ARIA SESSION SUMMARY
Course Elapsed Days: 0
Plan Fractions Treated to Date: 1
Plan Prescribed Dose Per Fraction: 2.67 Gy
Plan Total Fractions Prescribed: 15
Plan Total Prescribed Dose: 40.05 Gy
Reference Point Dosage Given to Date: 2.67 Gy
Reference Point Session Dosage Given: 2.67 Gy
Session Number: 1

## 2024-11-15 NOTE — Telephone Encounter (Signed)
 Left VM

## 2024-11-16 ENCOUNTER — Other Ambulatory Visit: Payer: Self-pay

## 2024-11-16 ENCOUNTER — Ambulatory Visit
Admission: RE | Admit: 2024-11-16 | Discharge: 2024-11-16 | Disposition: A | Payer: PRIVATE HEALTH INSURANCE | Source: Ambulatory Visit | Attending: Radiation Oncology

## 2024-11-16 DIAGNOSIS — Z510 Encounter for antineoplastic radiation therapy: Secondary | ICD-10-CM | POA: Diagnosis not present

## 2024-11-16 LAB — RAD ONC ARIA SESSION SUMMARY
Course Elapsed Days: 1
Plan Fractions Treated to Date: 2
Plan Prescribed Dose Per Fraction: 2.67 Gy
Plan Total Fractions Prescribed: 15
Plan Total Prescribed Dose: 40.05 Gy
Reference Point Dosage Given to Date: 5.34 Gy
Reference Point Session Dosage Given: 2.67 Gy
Session Number: 2

## 2024-11-21 ENCOUNTER — Other Ambulatory Visit: Payer: Self-pay

## 2024-11-21 ENCOUNTER — Ambulatory Visit
Admission: RE | Admit: 2024-11-21 | Discharge: 2024-11-21 | Disposition: A | Discharge status: Home / Self Care | Payer: PRIVATE HEALTH INSURANCE | Source: Ambulatory Visit | Attending: Radiation Oncology | Admitting: Radiation Oncology

## 2024-11-21 DIAGNOSIS — Z510 Encounter for antineoplastic radiation therapy: Secondary | ICD-10-CM | POA: Diagnosis not present

## 2024-11-21 LAB — RAD ONC ARIA SESSION SUMMARY
Course Elapsed Days: 6
Plan Fractions Treated to Date: 3
Plan Prescribed Dose Per Fraction: 2.67 Gy
Plan Total Fractions Prescribed: 15
Plan Total Prescribed Dose: 40.05 Gy
Reference Point Dosage Given to Date: 8.01 Gy
Reference Point Session Dosage Given: 2.67 Gy
Session Number: 3

## 2024-11-22 ENCOUNTER — Ambulatory Visit
Admission: RE | Admit: 2024-11-22 | Discharge: 2024-11-22 | Disposition: A | Discharge status: Home / Self Care | Payer: PRIVATE HEALTH INSURANCE | Source: Ambulatory Visit | Attending: Radiation Oncology | Admitting: Radiation Oncology

## 2024-11-22 ENCOUNTER — Other Ambulatory Visit: Payer: Self-pay

## 2024-11-22 DIAGNOSIS — Z510 Encounter for antineoplastic radiation therapy: Secondary | ICD-10-CM | POA: Diagnosis not present

## 2024-11-22 LAB — RAD ONC ARIA SESSION SUMMARY
Course Elapsed Days: 7
Plan Fractions Treated to Date: 4
Plan Prescribed Dose Per Fraction: 2.67 Gy
Plan Total Fractions Prescribed: 15
Plan Total Prescribed Dose: 40.05 Gy
Reference Point Dosage Given to Date: 10.68 Gy
Reference Point Session Dosage Given: 2.67 Gy
Session Number: 4

## 2024-11-23 ENCOUNTER — Ambulatory Visit
Admission: RE | Admit: 2024-11-23 | Discharge: 2024-11-23 | Disposition: A | Discharge status: Home / Self Care | Payer: PRIVATE HEALTH INSURANCE | Source: Ambulatory Visit | Attending: Radiation Oncology | Admitting: Radiation Oncology

## 2024-11-23 ENCOUNTER — Other Ambulatory Visit: Payer: Self-pay

## 2024-11-23 ENCOUNTER — Ambulatory Visit
Admission: RE | Admit: 2024-11-23 | Discharge: 2024-11-23 | Disposition: A | Discharge status: Home / Self Care | Source: Ambulatory Visit | Attending: Radiation Oncology | Admitting: Radiation Oncology

## 2024-11-23 DIAGNOSIS — Z510 Encounter for antineoplastic radiation therapy: Secondary | ICD-10-CM | POA: Diagnosis not present

## 2024-11-23 LAB — RAD ONC ARIA SESSION SUMMARY
Course Elapsed Days: 8
Plan Fractions Treated to Date: 5
Plan Prescribed Dose Per Fraction: 2.67 Gy
Plan Total Fractions Prescribed: 15
Plan Total Prescribed Dose: 40.05 Gy
Reference Point Dosage Given to Date: 13.35 Gy
Reference Point Session Dosage Given: 2.67 Gy
Session Number: 5

## 2024-11-25 ENCOUNTER — Ambulatory Visit
Admission: RE | Admit: 2024-11-25 | Discharge: 2024-11-25 | Disposition: A | Discharge status: Home / Self Care | Payer: PRIVATE HEALTH INSURANCE | Source: Ambulatory Visit | Attending: Radiation Oncology | Admitting: Radiation Oncology

## 2024-11-25 ENCOUNTER — Other Ambulatory Visit: Payer: Self-pay

## 2024-11-25 DIAGNOSIS — D0512 Intraductal carcinoma in situ of left breast: Secondary | ICD-10-CM | POA: Insufficient documentation

## 2024-11-25 LAB — RAD ONC ARIA SESSION SUMMARY
Course Elapsed Days: 10
Plan Fractions Treated to Date: 6
Plan Prescribed Dose Per Fraction: 2.67 Gy
Plan Total Fractions Prescribed: 15
Plan Total Prescribed Dose: 40.05 Gy
Reference Point Dosage Given to Date: 16.02 Gy
Reference Point Session Dosage Given: 2.67 Gy
Session Number: 6

## 2024-11-28 ENCOUNTER — Telehealth: Payer: Self-pay | Admitting: Hematology and Oncology

## 2024-11-28 ENCOUNTER — Ambulatory Visit
Admission: RE | Admit: 2024-11-28 | Discharge: 2024-11-28 | Disposition: A | Discharge status: Home / Self Care | Payer: PRIVATE HEALTH INSURANCE | Source: Ambulatory Visit | Attending: Radiation Oncology | Admitting: Radiation Oncology

## 2024-11-28 ENCOUNTER — Other Ambulatory Visit: Payer: Self-pay

## 2024-11-28 DIAGNOSIS — D0512 Intraductal carcinoma in situ of left breast: Secondary | ICD-10-CM | POA: Diagnosis not present

## 2024-11-28 LAB — RAD ONC ARIA SESSION SUMMARY
Course Elapsed Days: 13
Plan Fractions Treated to Date: 7
Plan Prescribed Dose Per Fraction: 2.67 Gy
Plan Total Fractions Prescribed: 15
Plan Total Prescribed Dose: 40.05 Gy
Reference Point Dosage Given to Date: 18.69 Gy
Reference Point Session Dosage Given: 2.67 Gy
Session Number: 7

## 2024-11-28 NOTE — Telephone Encounter (Signed)
 I spoke with patient as she called in to reschedule MD appointment from 12/21/2024 to 12/27/2024.

## 2024-11-29 ENCOUNTER — Ambulatory Visit
Admission: RE | Admit: 2024-11-29 | Discharge: 2024-11-29 | Disposition: A | Payer: PRIVATE HEALTH INSURANCE | Source: Ambulatory Visit | Attending: Radiation Oncology

## 2024-11-29 ENCOUNTER — Other Ambulatory Visit: Payer: Self-pay

## 2024-11-29 DIAGNOSIS — D0512 Intraductal carcinoma in situ of left breast: Secondary | ICD-10-CM | POA: Diagnosis not present

## 2024-11-29 LAB — RAD ONC ARIA SESSION SUMMARY
Course Elapsed Days: 14
Plan Fractions Treated to Date: 8
Plan Prescribed Dose Per Fraction: 2.67 Gy
Plan Total Fractions Prescribed: 15
Plan Total Prescribed Dose: 40.05 Gy
Reference Point Dosage Given to Date: 21.36 Gy
Reference Point Session Dosage Given: 2.67 Gy
Session Number: 8

## 2024-11-30 ENCOUNTER — Ambulatory Visit
Admission: RE | Admit: 2024-11-30 | Discharge: 2024-11-30 | Disposition: A | Discharge status: Home / Self Care | Payer: PRIVATE HEALTH INSURANCE | Source: Ambulatory Visit | Attending: Radiation Oncology | Admitting: Radiation Oncology

## 2024-11-30 ENCOUNTER — Ambulatory Visit
Admission: RE | Admit: 2024-11-30 | Discharge: 2024-11-30 | Disposition: A | Discharge status: Home / Self Care | Source: Ambulatory Visit | Attending: Radiation Oncology | Admitting: Radiation Oncology

## 2024-11-30 ENCOUNTER — Other Ambulatory Visit: Payer: Self-pay

## 2024-11-30 DIAGNOSIS — D0512 Intraductal carcinoma in situ of left breast: Secondary | ICD-10-CM | POA: Diagnosis not present

## 2024-11-30 LAB — RAD ONC ARIA SESSION SUMMARY
Course Elapsed Days: 15
Plan Fractions Treated to Date: 9
Plan Prescribed Dose Per Fraction: 2.67 Gy
Plan Total Fractions Prescribed: 15
Plan Total Prescribed Dose: 40.05 Gy
Reference Point Dosage Given to Date: 24.03 Gy
Reference Point Session Dosage Given: 2.67 Gy
Session Number: 9

## 2024-12-01 ENCOUNTER — Other Ambulatory Visit: Payer: Self-pay

## 2024-12-01 ENCOUNTER — Ambulatory Visit
Admission: RE | Admit: 2024-12-01 | Discharge: 2024-12-01 | Disposition: A | Discharge status: Home / Self Care | Payer: PRIVATE HEALTH INSURANCE | Source: Ambulatory Visit | Attending: Radiation Oncology | Admitting: Radiation Oncology

## 2024-12-01 DIAGNOSIS — D0512 Intraductal carcinoma in situ of left breast: Secondary | ICD-10-CM | POA: Diagnosis not present

## 2024-12-01 LAB — RAD ONC ARIA SESSION SUMMARY
Course Elapsed Days: 16
Plan Fractions Treated to Date: 10
Plan Prescribed Dose Per Fraction: 2.67 Gy
Plan Total Fractions Prescribed: 15
Plan Total Prescribed Dose: 40.05 Gy
Reference Point Dosage Given to Date: 26.7 Gy
Reference Point Session Dosage Given: 2.67 Gy
Session Number: 10

## 2024-12-02 ENCOUNTER — Ambulatory Visit
Admission: RE | Admit: 2024-12-02 | Discharge: 2024-12-02 | Disposition: A | Payer: PRIVATE HEALTH INSURANCE | Source: Ambulatory Visit | Attending: Radiation Oncology

## 2024-12-02 ENCOUNTER — Other Ambulatory Visit: Payer: Self-pay

## 2024-12-02 DIAGNOSIS — D0512 Intraductal carcinoma in situ of left breast: Secondary | ICD-10-CM | POA: Diagnosis not present

## 2024-12-02 LAB — RAD ONC ARIA SESSION SUMMARY
Course Elapsed Days: 17
Plan Fractions Treated to Date: 11
Plan Prescribed Dose Per Fraction: 2.67 Gy
Plan Total Fractions Prescribed: 15
Plan Total Prescribed Dose: 40.05 Gy
Reference Point Dosage Given to Date: 29.37 Gy
Reference Point Session Dosage Given: 2.1578 Gy
Session Number: 11

## 2024-12-05 ENCOUNTER — Other Ambulatory Visit: Payer: Self-pay

## 2024-12-05 ENCOUNTER — Ambulatory Visit
Admission: RE | Admit: 2024-12-05 | Discharge: 2024-12-05 | Disposition: A | Payer: PRIVATE HEALTH INSURANCE | Source: Ambulatory Visit | Attending: Radiation Oncology

## 2024-12-05 DIAGNOSIS — D0512 Intraductal carcinoma in situ of left breast: Secondary | ICD-10-CM | POA: Diagnosis not present

## 2024-12-05 LAB — RAD ONC ARIA SESSION SUMMARY
Course Elapsed Days: 20
Plan Fractions Treated to Date: 12
Plan Prescribed Dose Per Fraction: 2.67 Gy
Plan Total Fractions Prescribed: 15
Plan Total Prescribed Dose: 40.05 Gy
Reference Point Dosage Given to Date: 32.04 Gy
Reference Point Session Dosage Given: 2.67 Gy
Session Number: 12

## 2024-12-06 ENCOUNTER — Other Ambulatory Visit: Payer: Self-pay

## 2024-12-06 ENCOUNTER — Ambulatory Visit
Admission: RE | Admit: 2024-12-06 | Discharge: 2024-12-06 | Disposition: A | Payer: PRIVATE HEALTH INSURANCE | Source: Ambulatory Visit | Attending: Radiation Oncology

## 2024-12-06 DIAGNOSIS — D0512 Intraductal carcinoma in situ of left breast: Secondary | ICD-10-CM | POA: Diagnosis not present

## 2024-12-06 LAB — RAD ONC ARIA SESSION SUMMARY
Course Elapsed Days: 21
Plan Fractions Treated to Date: 13
Plan Prescribed Dose Per Fraction: 2.67 Gy
Plan Total Fractions Prescribed: 15
Plan Total Prescribed Dose: 40.05 Gy
Reference Point Dosage Given to Date: 34.71 Gy
Reference Point Session Dosage Given: 2.67 Gy
Session Number: 13

## 2024-12-07 ENCOUNTER — Other Ambulatory Visit: Payer: Self-pay

## 2024-12-07 ENCOUNTER — Other Ambulatory Visit: Payer: Self-pay | Admitting: Radiation Oncology

## 2024-12-07 ENCOUNTER — Ambulatory Visit

## 2024-12-07 ENCOUNTER — Ambulatory Visit: Payer: PRIVATE HEALTH INSURANCE | Admitting: Radiation Oncology

## 2024-12-07 ENCOUNTER — Ambulatory Visit
Admission: RE | Admit: 2024-12-07 | Discharge: 2024-12-07 | Disposition: A | Discharge status: Home / Self Care | Payer: PRIVATE HEALTH INSURANCE | Source: Ambulatory Visit | Attending: Radiation Oncology | Admitting: Radiation Oncology

## 2024-12-07 DIAGNOSIS — D0512 Intraductal carcinoma in situ of left breast: Secondary | ICD-10-CM | POA: Diagnosis not present

## 2024-12-07 LAB — RAD ONC ARIA SESSION SUMMARY
Course Elapsed Days: 22
Plan Fractions Treated to Date: 14
Plan Prescribed Dose Per Fraction: 2.67 Gy
Plan Total Fractions Prescribed: 15
Plan Total Prescribed Dose: 40.05 Gy
Reference Point Dosage Given to Date: 37.38 Gy
Reference Point Session Dosage Given: 2.67 Gy
Session Number: 14

## 2024-12-07 MED ORDER — SILVER SULFADIAZINE 1 % EX CREA: 1.0000 | TOPICAL_CREAM | Freq: Every day | CUTANEOUS | 0 refills | Status: AC

## 2024-12-08 ENCOUNTER — Other Ambulatory Visit: Payer: Self-pay

## 2024-12-08 ENCOUNTER — Ambulatory Visit
Admission: RE | Admit: 2024-12-08 | Discharge: 2024-12-08 | Disposition: A | Payer: PRIVATE HEALTH INSURANCE | Source: Ambulatory Visit | Attending: Radiation Oncology

## 2024-12-08 DIAGNOSIS — D0512 Intraductal carcinoma in situ of left breast: Secondary | ICD-10-CM | POA: Diagnosis not present

## 2024-12-08 LAB — RAD ONC ARIA SESSION SUMMARY
Course Elapsed Days: 23
Plan Fractions Treated to Date: 15
Plan Prescribed Dose Per Fraction: 2.67 Gy
Plan Total Fractions Prescribed: 15
Plan Total Prescribed Dose: 40.05 Gy
Reference Point Dosage Given to Date: 40.05 Gy
Reference Point Session Dosage Given: 2.4635 Gy
Session Number: 15

## 2024-12-09 ENCOUNTER — Ambulatory Visit: Payer: PRIVATE HEALTH INSURANCE

## 2024-12-12 ENCOUNTER — Ambulatory Visit
Admission: RE | Admit: 2024-12-12 | Discharge: 2024-12-12 | Disposition: A | Payer: PRIVATE HEALTH INSURANCE | Source: Ambulatory Visit | Attending: Radiation Oncology

## 2024-12-12 ENCOUNTER — Other Ambulatory Visit: Payer: Self-pay

## 2024-12-12 DIAGNOSIS — D0512 Intraductal carcinoma in situ of left breast: Secondary | ICD-10-CM | POA: Diagnosis not present

## 2024-12-12 LAB — RAD ONC ARIA SESSION SUMMARY
Course Elapsed Days: 27
Plan Fractions Treated to Date: 1
Plan Prescribed Dose Per Fraction: 2 Gy
Plan Total Fractions Prescribed: 5
Plan Total Prescribed Dose: 10 Gy
Reference Point Dosage Given to Date: 2 Gy
Reference Point Session Dosage Given: 2 Gy
Session Number: 16

## 2024-12-13 ENCOUNTER — Other Ambulatory Visit: Payer: Self-pay

## 2024-12-13 ENCOUNTER — Ambulatory Visit
Admission: RE | Admit: 2024-12-13 | Discharge: 2024-12-13 | Disposition: A | Payer: PRIVATE HEALTH INSURANCE | Source: Ambulatory Visit | Attending: Radiation Oncology

## 2024-12-13 DIAGNOSIS — D0512 Intraductal carcinoma in situ of left breast: Secondary | ICD-10-CM | POA: Diagnosis not present

## 2024-12-13 LAB — RAD ONC ARIA SESSION SUMMARY
Course Elapsed Days: 28
Plan Fractions Treated to Date: 2
Plan Prescribed Dose Per Fraction: 2 Gy
Plan Total Fractions Prescribed: 5
Plan Total Prescribed Dose: 10 Gy
Reference Point Dosage Given to Date: 4 Gy
Reference Point Session Dosage Given: 1.8587 Gy
Session Number: 17

## 2024-12-14 ENCOUNTER — Ambulatory Visit
Admission: RE | Admit: 2024-12-14 | Discharge: 2024-12-14 | Disposition: A | Source: Ambulatory Visit | Attending: Radiation Oncology

## 2024-12-14 ENCOUNTER — Other Ambulatory Visit: Payer: Self-pay

## 2024-12-14 ENCOUNTER — Ambulatory Visit
Admission: RE | Admit: 2024-12-14 | Discharge: 2024-12-14 | Disposition: A | Payer: PRIVATE HEALTH INSURANCE | Source: Ambulatory Visit | Attending: Radiation Oncology

## 2024-12-14 DIAGNOSIS — D0512 Intraductal carcinoma in situ of left breast: Secondary | ICD-10-CM

## 2024-12-14 LAB — RAD ONC ARIA SESSION SUMMARY
Course Elapsed Days: 29
Plan Fractions Treated to Date: 3
Plan Prescribed Dose Per Fraction: 2 Gy
Plan Total Fractions Prescribed: 5
Plan Total Prescribed Dose: 10 Gy
Reference Point Dosage Given to Date: 6 Gy
Reference Point Session Dosage Given: 2 Gy
Session Number: 18

## 2024-12-14 MED ORDER — RADIAPLEXRX EX GEL: Freq: Once | CUTANEOUS | Status: AC

## 2024-12-14 MED ORDER — LIDOCAINE-PRILOCAINE 2.5-2.5 % EX CREA: 1.0000 | TOPICAL_CREAM | CUTANEOUS | 0 refills | Status: AC | PRN

## 2024-12-15 ENCOUNTER — Other Ambulatory Visit: Payer: Self-pay

## 2024-12-15 ENCOUNTER — Ambulatory Visit: Payer: PRIVATE HEALTH INSURANCE

## 2024-12-15 ENCOUNTER — Ambulatory Visit
Admission: RE | Admit: 2024-12-15 | Discharge: 2024-12-15 | Disposition: A | Discharge status: Home / Self Care | Source: Ambulatory Visit | Attending: Radiation Oncology | Admitting: Radiation Oncology

## 2024-12-15 DIAGNOSIS — D0512 Intraductal carcinoma in situ of left breast: Secondary | ICD-10-CM | POA: Diagnosis not present

## 2024-12-15 LAB — RAD ONC ARIA SESSION SUMMARY
Course Elapsed Days: 30
Plan Fractions Treated to Date: 4
Plan Prescribed Dose Per Fraction: 2 Gy
Plan Total Fractions Prescribed: 5
Plan Total Prescribed Dose: 10 Gy
Reference Point Dosage Given to Date: 8 Gy
Reference Point Session Dosage Given: 2 Gy
Session Number: 19

## 2024-12-16 ENCOUNTER — Other Ambulatory Visit: Payer: Self-pay

## 2024-12-16 ENCOUNTER — Ambulatory Visit
Admission: RE | Admit: 2024-12-16 | Discharge: 2024-12-16 | Disposition: A | Source: Ambulatory Visit | Attending: Radiation Oncology

## 2024-12-16 DIAGNOSIS — D0512 Intraductal carcinoma in situ of left breast: Secondary | ICD-10-CM | POA: Diagnosis not present

## 2024-12-16 LAB — RAD ONC ARIA SESSION SUMMARY
Course Elapsed Days: 31
Plan Fractions Treated to Date: 5
Plan Prescribed Dose Per Fraction: 2 Gy
Plan Total Fractions Prescribed: 5
Plan Total Prescribed Dose: 10 Gy
Reference Point Dosage Given to Date: 10 Gy
Reference Point Session Dosage Given: 2 Gy
Session Number: 20

## 2024-12-19 NOTE — Radiation Completion Notes (Signed)
 Patient Name: Beth Fuller, Beth Fuller MRN: [id number] Date of Birth: 1956-10-22 Referring Physician: AMBER STALLS, M.D. Date of Service: 2024-12-19 Radiation Oncologist: Estefana Cha, M.D. [facility] [place]                             RADIATION ONCOLOGY END OF TREATMENT NOTE     Diagnosis: D05.12 Intraductal carcinoma in situ of left breast Staging on 2024-08-24: Ductal carcinoma in situ (DCIS) of left breast T=cTis (DCIS), N=cN0, M=cM0 Intent: Curative     ==========DELIVERED PLANS==========  First Treatment Date: 2024-11-15 Last Treatment Date: 2024-12-16   Plan Name: Breast_L_BH Site: Breast, Left Technique: 3D Mode: Photon Dose Per Fraction: 2.67 Gy Prescribed Dose (Delivered / Prescribed): 40.05 Gy / 40.05 Gy Prescribed Fxs (Delivered / Prescribed): 15 / 15   Plan Name: Brst_L_Bst_BH Site: Breast, Left Technique: 3D Mode: Photon Dose Per Fraction: 2 Gy Prescribed Dose (Delivered / Prescribed): 10 Gy / 10 Gy Prescribed Fxs (Delivered / Prescribed): 5 / 5     ==========ON TREATMENT VISIT DATES========== 2024-11-23, 2024-11-30, 2024-12-07, 2024-12-14     ==========UPCOMING VISITS========== 03/01/2025 [facility] SURVIVORSHIP CARE PLAN VISIT Crawford Morna Pickle, NP  03/01/2025 [facility] NURSE EVAL [facility] NURSE VISIT  01/09/2025 [facility] FOLLOW UP 30 Cha Estefana, MD  12/27/2024 [facility] EST PT 15 Stalls Amber, MD        ==========APPENDIX - ON TREATMENT VISIT NOTES==========   See weekly On Treatment Notes in Epic for details in the Media tab (listed as Progress notes on the On Treatment Visit Dates listed above).

## 2024-12-21 ENCOUNTER — Telehealth: Payer: Self-pay | Admitting: Hematology and Oncology

## 2024-12-21 ENCOUNTER — Inpatient Hospital Stay: Payer: PRIVATE HEALTH INSURANCE | Admitting: Hematology and Oncology

## 2024-12-21 NOTE — Telephone Encounter (Signed)
 I spoke with patient and she rescheduled her appointment with MD to 01/10/2025 from 12/28/2024 due to going out of town.

## 2024-12-27 ENCOUNTER — Inpatient Hospital Stay: Payer: PRIVATE HEALTH INSURANCE | Admitting: Hematology and Oncology

## 2025-01-09 ENCOUNTER — Ambulatory Visit: Admitting: Radiation Oncology

## 2025-01-10 ENCOUNTER — Inpatient Hospital Stay: Admitting: Hematology and Oncology

## 2025-02-28 ENCOUNTER — Inpatient Hospital Stay: Payer: PRIVATE HEALTH INSURANCE | Admitting: Adult Health

## 2025-02-28 ENCOUNTER — Inpatient Hospital Stay: Payer: PRIVATE HEALTH INSURANCE

## 2025-03-01 ENCOUNTER — Inpatient Hospital Stay: Payer: PRIVATE HEALTH INSURANCE | Admitting: Adult Health

## 2025-03-01 ENCOUNTER — Inpatient Hospital Stay: Payer: PRIVATE HEALTH INSURANCE
